# Patient Record
Sex: Female | Born: 1980 | Race: White | Hispanic: No | Marital: Single | Smoking: Never smoker
Health system: Southern US, Community
[De-identification: ages and names within clinical notes are randomized; demographics above are authoritative.]

## PROBLEM LIST (undated history)

## (undated) DIAGNOSIS — E039 Hypothyroidism, unspecified: Secondary | ICD-10-CM

## (undated) DIAGNOSIS — K219 Gastro-esophageal reflux disease without esophagitis: Secondary | ICD-10-CM

## (undated) DIAGNOSIS — J45909 Unspecified asthma, uncomplicated: Secondary | ICD-10-CM

## (undated) DIAGNOSIS — G4730 Sleep apnea, unspecified: Secondary | ICD-10-CM

## (undated) DIAGNOSIS — R519 Headache, unspecified: Secondary | ICD-10-CM

## (undated) DIAGNOSIS — F419 Anxiety disorder, unspecified: Secondary | ICD-10-CM

## (undated) DIAGNOSIS — M797 Fibromyalgia: Secondary | ICD-10-CM

## (undated) DIAGNOSIS — F32A Depression, unspecified: Secondary | ICD-10-CM

## (undated) HISTORY — PX: ABDOMINAL HYSTERECTOMY: SHX81

---

## 2007-11-18 HISTORY — PX: BREAST REDUCTION SURGERY: SHX8

## 2018-08-11 ENCOUNTER — Other Ambulatory Visit (HOSPITAL_COMMUNITY): Payer: Self-pay | Admitting: Neurology

## 2018-08-11 ENCOUNTER — Ambulatory Visit (HOSPITAL_COMMUNITY)
Admission: RE | Admit: 2018-08-11 | Discharge: 2018-08-11 | Disposition: A | Discharge status: Home / Self Care | Payer: Medicaid Other | Source: Ambulatory Visit | Attending: Neurology | Admitting: Neurology

## 2018-08-11 DIAGNOSIS — M25512 Pain in left shoulder: Secondary | ICD-10-CM

## 2018-09-16 ENCOUNTER — Encounter (HOSPITAL_COMMUNITY): Payer: Self-pay

## 2018-09-16 ENCOUNTER — Telehealth (HOSPITAL_COMMUNITY): Payer: Self-pay | Admitting: Occupational Therapy

## 2018-09-16 ENCOUNTER — Ambulatory Visit (HOSPITAL_COMMUNITY): Payer: Medicaid Other | Admitting: Occupational Therapy

## 2018-09-16 NOTE — Telephone Encounter (Signed)
Patient is not feeling well

## 2018-09-28 ENCOUNTER — Other Ambulatory Visit: Payer: Self-pay

## 2018-09-28 ENCOUNTER — Ambulatory Visit (HOSPITAL_COMMUNITY): Payer: Medicaid Other | Attending: Neurology

## 2018-09-28 ENCOUNTER — Encounter (HOSPITAL_COMMUNITY): Payer: Self-pay

## 2018-09-28 DIAGNOSIS — R29898 Other symptoms and signs involving the musculoskeletal system: Secondary | ICD-10-CM | POA: Diagnosis present

## 2018-09-28 DIAGNOSIS — G8929 Other chronic pain: Secondary | ICD-10-CM | POA: Insufficient documentation

## 2018-09-28 DIAGNOSIS — M25612 Stiffness of left shoulder, not elsewhere classified: Secondary | ICD-10-CM | POA: Insufficient documentation

## 2018-09-28 DIAGNOSIS — M25512 Pain in left shoulder: Secondary | ICD-10-CM | POA: Insufficient documentation

## 2018-09-28 NOTE — Therapy (Signed)
[facility] [address] [place], [place], [address] Phone: [phone]   Fax:  [phone]  Occupational Therapy Evaluation  Patient Details  Name: Sue [name] MRN: [id number] Date of Birth: Feb 17, 1981 Referring Provider (OT): Jorge Mandril, NP   Encounter Date: 09/28/2018  OT End of Session - 09/28/18 1507    Visit Number  1    Number of Visits  8    Date for OT Re-Evaluation  10/26/18    Authorization Type  Medicaid - requesting initial 3 visits from medicaid on 09/28/18    OT Start Time  1118    OT Stop Time  1218    OT Time Calculation (min)  60 min    Activity Tolerance  Patient tolerated treatment well;Patient limited by pain    Behavior During Therapy  [facility] for tasks assessed/performed   tearful at times when discussing pain      History reviewed. No pertinent past medical history.  History reviewed. No pertinent surgical history.  There were no vitals filed for this visit.  Subjective Assessment - 09/28/18 1140    Subjective   S: I've been in a lot of pain for a long time.     Pertinent History  Patient is a 37 y/o female presenting with new onset of increased left shoulder pain with no known cause. Increased pain begain approximately 4-5 months ago. Typical average pain level stays between 5 and 8/10. Recently, pain level has been increasing and remaining at the higher range. ROM and strength have been affected. Jorge Mandril, NP has referred patient to occupational therapy for evaluation and treatment.     Patient Stated Goals  To increase ROM and strength.     Currently in Pain?  Yes    Pain Score  7     Pain Location  Shoulder    Pain Orientation  Left    Pain Descriptors / Indicators  Aching    Pain Type  Chronic pain    Pain Radiating Towards  shoulder down elbow    Pain Onset  More than a month ago    Pain Frequency  Constant    Aggravating Factors   Cold aggrevates    Pain Relieving Factors  heating pad, not  moving it.    Effect of Pain on Daily Activities  Mod-severe effect    Multiple Pain Sites  No        [facility] OT Assessment - 09/28/18 1132      Assessment   Medical Diagnosis  Left shoulder pain    Referring Provider (OT)  Jorge Mandril, NP    Onset Date/Surgical Date  --   4-5 months   Hand Dominance  Right    Next MD Visit  10/06/18    Prior Therapy  None for this condition      Precautions   Precautions  None      Restrictions   Weight Bearing Restrictions  No      Balance Screen   Has the patient fallen in the past 6 months  No   Several close calls     Home  Environment   Family/patient expects to be discharged to:  Private residence    Living Arrangements  Children    Home Equipment  [place] - single point    Additional Comments  45 year old daughter      Prior Function   Level of Independence  Requires assistive device for independence    Vocation  On disability   SSI     ADL   ADL comments  Difficulty using the cane in the left hand due to pain in the shoulder. patient normally alternates between the right and left hand when walking with cane. Patient is unable to rely on her LUE to assist as much as before. LUE locks up. Is normally weaker. Difficulty getting out of bed at times.       Mobility   Mobility Status  Independent      Written Expression   Dominant Hand  Right      Vision - History   Baseline Vision  Wears glasses all the time      Cognition   Overall Cognitive Status  Within Functional Limits for tasks assessed      Posture/Postural Control   Posture/Postural Control  Postural limitations    Postural Limitations  Rounded Shoulders;Forward head      ROM / Strength   AROM / PROM / Strength  AROM;Strength;PROM      Palpation   Palpation comment  Moderate fascial restrictions in left upper arm, trapezius, and scapularis region.       AROM   Overall AROM Comments  Assessed seated. IR/er adducted    AROM Assessment Site  Shoulder     Right/Left Shoulder  Left    Left Shoulder Flexion  90 Degrees   right: 170   Left Shoulder ABduction  35 Degrees   right: 170   Left Shoulder Internal Rotation  90 Degrees   right: 90   Left Shoulder External Rotation  70 Degrees   right: 70     PROM   Overall PROM Comments  Assessed supine. IR/er adducted.    PROM Assessment Site  Shoulder    Right/Left Shoulder  Left    Left Shoulder Flexion  97 Degrees    Left Shoulder ABduction  80 Degrees    Left Shoulder Internal Rotation  90 Degrees    Left Shoulder External Rotation  70 Degrees      Strength   Overall Strength Comments  Assessed seated. IR/er adducted    Strength Assessment Site  Shoulder    Right/Left Shoulder  Left    Left Shoulder Flexion  3-/5    Left Shoulder ABduction  3-/5    Left Shoulder Internal Rotation  4-/5    Left Shoulder External Rotation  4+/5                      OT Education - 09/28/18 1507    Education Details  table slides    Person(s) Educated  Patient    Methods  Explanation;Verbal cues;Demonstration;Handout    Comprehension  Verbalized understanding;Returned demonstration       OT Short Term Goals - 09/28/18 1515      OT SHORT TERM GOAL #1   Title  patient will be educated and independent with a HEP to faciliate her progress in therapy and increase her ability to use her LUE as her non dominant with all daily tasks.    Time  4    Period  Weeks    Status  New    Target Date  10/26/18      OT SHORT TERM GOAL #2   Title  Patient will increase Left shoulder A/ROM to WFL/equal to right UE in order to be able to reach at or above shoulder level in order to complete normal daily tasks.     Time  4  Period  Weeks    Status  New      OT SHORT TERM GOAL #3   Title  Patient will increase her LUE strength to 4/5 in order to return to using her left UE for typical daily lifting tasks with increased comfort.     Time  4    Period  Weeks    Status  New      OT SHORT TERM  GOAL #4   Title  patient will report a decrease of pain level in her left arm to 5/10 or less with use of pain management techniques if needed in order to use her cane in either her Right or Left UE if needed.     Time  4    Period  Weeks    Status  New      OT SHORT TERM GOAL #5   Title  Patient will decrease fascial restrictions in left UE to minimal amount or less in order to increase functional mobility and comfort level in order to complete daily tasks.     Time  4    Period  Weeks    Status  New               Plan - 09/28/18 1510    Clinical Impression Statement  A: Patient is a 37 y/o female S/P left shoulder pain causing increased fascial restrictions and decreased strength and ROM resulting in difficulty completing daily tasks using her left UE as her non dominant extremity.     Occupational Profile and client history currently impacting functional performance  motivated to participate in therapy.     Occupational performance deficits (Please refer to evaluation for details):  ADL's;IADL's;Rest and Sleep;Leisure    Rehab Potential  Excellent    Current Impairments/barriers affecting progress:  chronic pain. multiple issues causing pain. Mulitple diagnosis' with minimal success with treatment or management.     OT Frequency  2x / week    OT Duration  4 weeks    OT Treatment/Interventions  Therapeutic exercise;Self-care/ADL training;Manual Therapy;Neuromuscular education;Ultrasound;Therapeutic activities;DME and/or AE instruction;Paraffin;Cryotherapy;Electrical Stimulation;Moist Heat;Passive range of motion;Patient/family education    Plan  P: Patient will benefit from skilled OT services to increase functional performance during daily tasks while using her left UE as her non dominant extremity with greater comfort. Treatment plan: Myofascial release, manual stretching, AA/ROM, A/ROM, general shoulder and scapular strengthening. Modalities PRN. Provide information on pain  management clinics.     Clinical Decision Making  Multiple treatment options, significant modification of task necessary    Consulted and Agree with Plan of Care  Patient       Patient will benefit from skilled therapeutic intervention in order to improve the following deficits and impairments:  Decreased coping skills, Decreased strength, Decreased activity tolerance, Pain, Decreased range of motion, Impaired UE functional use  Visit Diagnosis: Other symptoms and signs involving the musculoskeletal system - Plan: Ot plan of care cert/re-cert  Chronic left shoulder pain - Plan: Ot plan of care cert/re-cert  Stiffness of left shoulder, not elsewhere classified - Plan: Ot plan of care cert/re-cert    Problem List There are no active problems to display for this patient.  Limmie Patricia, OTR/L,CBIS  [phone]  09/28/2018, 3:36 PM  [place] [facility] [address] [place], [place], [address] Phone: [phone]   Fax:  [phone]  Name: Sue [name] MRN: [id number] Date of Birth: 02/16/1981

## 2018-09-28 NOTE — Patient Instructions (Signed)
Complete 2-3 times a day. Each exercise 1-3 minutes.    SHOULDER: Flexion On Table   Place hands on towel placed on table, elbows straight. Lean forward with you upper body, pushing towel away from body.   Abduction (Passive)   With arm out to side, resting on towel placed on table, keeping trunk away from table, lean to the side while pushing towel away from body.  Copyright  VHI. All rights reserved.     Internal Rotation (Assistive)   Seated with elbow bent at right angle and held against side, slide arm on table surface in an inward arc keeping elbow anchored in place. Activity: Use this motion to brush crumbs off the table.  Copyright  VHI. All rights reserved.

## 2018-10-05 ENCOUNTER — Telehealth (HOSPITAL_COMMUNITY): Payer: Self-pay | Admitting: Internal Medicine

## 2018-10-05 ENCOUNTER — Ambulatory Visit (HOSPITAL_COMMUNITY): Payer: Medicaid Other

## 2018-10-05 NOTE — Telephone Encounter (Signed)
10/05/18  PT CX VIA PHONE TREE -- I CALLED TO CONFIRM AND SHE SAID SHE HAD A DRS APPT AND WILL RESCHEDULE WHEN SHE COMES IN AGAIN

## 2018-10-07 ENCOUNTER — Ambulatory Visit (HOSPITAL_COMMUNITY): Payer: Medicaid Other

## 2018-10-07 ENCOUNTER — Encounter (HOSPITAL_COMMUNITY): Payer: Self-pay

## 2018-10-07 DIAGNOSIS — R29898 Other symptoms and signs involving the musculoskeletal system: Secondary | ICD-10-CM | POA: Diagnosis not present

## 2018-10-07 DIAGNOSIS — G8929 Other chronic pain: Secondary | ICD-10-CM

## 2018-10-07 DIAGNOSIS — M25612 Stiffness of left shoulder, not elsewhere classified: Secondary | ICD-10-CM

## 2018-10-07 DIAGNOSIS — M25512 Pain in left shoulder: Secondary | ICD-10-CM

## 2018-10-07 NOTE — Therapy (Signed)
[place] [facility] [address] [place], [place], [address] Phone: [phone]   Fax:  [phone]  Occupational Therapy Treatment  Patient Details  Name: Sue [name] MRN: [id number] Date of Birth: 1981-01-31 Referring Provider (OT): Jorge Mandril, NP   Encounter Date: 10/07/2018  OT End of Session - 10/07/18 0941    Visit Number  2    Number of Visits  8    Date for OT Re-Evaluation  10/26/18    Authorization Type  Medicaid - requesting initial 3 visits from medicaid on 09/28/18    OT Start Time  0905    OT Stop Time  0945    OT Time Calculation (min)  40 min    Activity Tolerance  Patient tolerated treatment well    Behavior During Therapy  [facility] for tasks assessed/performed   tearful at times when discussing pain      History reviewed. No pertinent past medical history.  History reviewed. No pertinent surgical history.  There were no vitals filed for this visit.  Subjective Assessment - 10/07/18 0916    Subjective   S: I didn't take my pain pill this morning because I wouldn't be able to function if I did.     Currently in Pain?  Yes    Pain Score  8     Pain Location  Shoulder    Pain Orientation  Left    Pain Descriptors / Indicators  Aching    Pain Type  Chronic pain    Pain Radiating Towards  shoulder down to elbow    Pain Onset  More than a month ago    Pain Frequency  Constant    Aggravating Factors   cold    Pain Relieving Factors  heating pad, not moving it    Effect of Pain on Daily Activities  mod-severe effect    Multiple Pain Sites  No         [facility] OT Assessment - 10/07/18 0934      Assessment   Medical Diagnosis  Left shoulder pain      Precautions   Precautions  None               OT Treatments/Exercises (OP) - 10/07/18 0935      Exercises   Exercises  Shoulder      Shoulder Exercises: Supine   Protraction  PROM;AROM;10 reps    Horizontal ABduction  PROM;10 reps    External Rotation   PROM;AAROM;10 reps   abducted   Internal Rotation  PROM;AAROM;10 reps   abducted   Flexion  PROM;AAROM;10 reps    ABduction  PROM;10 reps      Manual Therapy   Manual Therapy  Myofascial release    Manual therapy comments  Manual therapy completed prior to exercises.     Myofascial Release  Myofascial release and manual stretching completed to left upper arm, trapezius, and scapularis region to decrease fascial restrictions and increase joint mobility in a pain free zone.              OT Education - 10/07/18 0941    Education Details  Reviewed goals with patient    Person(s) Educated  Patient    Methods  Explanation    Comprehension  Verbalized understanding       OT Short Term Goals - 10/07/18 0942      OT SHORT TERM GOAL #1   Title  patient will be educated and independent with a HEP  to faciliate her progress in therapy and increase her ability to use her LUE as her non dominant with all daily tasks.    Time  4    Period  Weeks    Status  On-going      OT SHORT TERM GOAL #2   Title  Patient will increase Left shoulder A/ROM to WFL/equal to right UE in order to be able to reach at or above shoulder level in order to complete normal daily tasks.     Time  4    Period  Weeks    Status  On-going      OT SHORT TERM GOAL #3   Title  Patient will increase her LUE strength to 4/5 in order to return to using her left UE for typical daily lifting tasks with increased comfort.     Time  4    Period  Weeks    Status  On-going      OT SHORT TERM GOAL #4   Title  patient will report a decrease of pain level in her left arm to 5/10 or less with use of pain management techniques if needed in order to use her cane in either her Right or Left UE if needed.     Time  4    Period  Weeks    Status  On-going      OT SHORT TERM GOAL #5   Title  Patient will decrease fascial restrictions in left UE to minimal amount or less in order to increase functional mobility and comfort level  in order to complete daily tasks.     Time  4    Period  Weeks    Status  On-going               Plan - 10/07/18 9794    Clinical Impression Statement  A: pt arrives to therapy with continued elevated pain although was able to tolerate full P/ROM of her LUE. Patient completed AA/ROM supine due to full range of motion. VC for form and technique.     Plan  P: Continue with AA/ROM and progress to seated AA/ROM if able to tolerate. Continue with manual therapy for fascial restrictions on the anterior and upper trapezius and scapularis region.     Consulted and Agree with Plan of Care  Patient       Patient will benefit from skilled therapeutic intervention in order to improve the following deficits and impairments:  Decreased coping skills, Decreased strength, Decreased activity tolerance, Pain, Decreased range of motion, Impaired UE functional use  Visit Diagnosis: Other symptoms and signs involving the musculoskeletal system  Chronic left shoulder pain  Stiffness of left shoulder, not elsewhere classified    Problem List There are no active problems to display for this patient.  Limmie Patricia, OTR/L,CBIS  [phone]  10/07/2018, 9:46 AM  [place] [facility] [address] [place], [place], [address] Phone: [phone]   Fax:  [phone]  Name: Sue [name] MRN: [id number] Date of Birth: [birth year]

## 2018-10-08 ENCOUNTER — Ambulatory Visit (HOSPITAL_COMMUNITY): Payer: Medicaid Other

## 2018-10-08 ENCOUNTER — Encounter (HOSPITAL_COMMUNITY): Payer: Self-pay

## 2018-10-08 DIAGNOSIS — M25612 Stiffness of left shoulder, not elsewhere classified: Secondary | ICD-10-CM

## 2018-10-08 DIAGNOSIS — R29898 Other symptoms and signs involving the musculoskeletal system: Secondary | ICD-10-CM

## 2018-10-08 DIAGNOSIS — G8929 Other chronic pain: Secondary | ICD-10-CM

## 2018-10-08 DIAGNOSIS — M25512 Pain in left shoulder: Secondary | ICD-10-CM

## 2018-10-08 NOTE — Therapy (Signed)
[place] [facility] [address] [place], [place], [address] Phone: [phone]   Fax:  [phone]  Occupational Therapy Treatment  Patient Details  Name: Sue [name] MRN: [id number] Date of Birth: [birth year] Referring Provider (OT): Jorge Mandril, NP   Encounter Date: 10/08/2018  OT End of Session - 10/08/18 1103    Visit Number  3    Number of Visits  8    Date for OT Re-Evaluation  10/26/18    Authorization Type  Medicaid     Authorization Time Period  Approved 3 visits (09/29/18-10/12/18)    Authorization - Visit Number  2    Authorization - Number of Visits  3    OT Start Time  0950    OT Stop Time  1030    OT Time Calculation (min)  40 min    Activity Tolerance  Patient tolerated treatment well    Behavior During Therapy  [facility] for tasks assessed/performed       History reviewed. No pertinent past medical history.  History reviewed. No pertinent surgical history.  There were no vitals filed for this visit.  Subjective Assessment - 10/08/18 0959    Subjective   S: I really wish I could I take my pain pill right now.     Currently in Pain?  Yes    Pain Score  9     Pain Location  Shoulder    Pain Orientation  Left    Pain Descriptors / Indicators  Aching    Pain Type  Chronic pain         [facility] OT Assessment - 10/08/18 1000      Assessment   Medical Diagnosis  Left shoulder pain      Precautions   Precautions  None               OT Treatments/Exercises (OP) - 10/08/18 1000      Exercises   Exercises  Shoulder      Shoulder Exercises: Supine   Protraction  AAROM;10 reps    Horizontal ABduction  AAROM;10 reps    External Rotation  AAROM;10 reps    Internal Rotation  AAROM;10 reps    Flexion  AAROM;10 reps    ABduction  AAROM;10 reps      Shoulder Exercises: Seated   Elevation  --    Protraction  AAROM;10 reps    Flexion  AAROM;10 reps    Abduction  AAROM;10 reps      Modalities   Modalities   Electrical Stimulation;Moist Heat      Moist Heat Therapy   Number Minutes Moist Heat  10 Minutes    Moist Heat Location  Shoulder      Electrical Stimulation   Electrical Stimulation Location  left shoulder and upper trapezius region    Electrical Stimulation Action  interferential    Electrical Stimulation Parameters  11.5 CV    Electrical Stimulation Goals  Pain             OT Education - 10/08/18 1102    Education Details  Provided protraction, abduction, and flexion for AA/ROM shoulder seated.     Person(s) Educated  Patient    Methods  Explanation;Handout;Verbal cues;Demonstration    Comprehension  Verbalized understanding;Returned demonstration       OT Short Term Goals - 10/07/18 0942      OT SHORT TERM GOAL #1   Title  patient will be educated and independent with a HEP to faciliate  her progress in therapy and increase her ability to use her LUE as her non dominant with all daily tasks.    Time  4    Period  Weeks    Status  On-going      OT SHORT TERM GOAL #2   Title  Patient will increase Left shoulder A/ROM to WFL/equal to right UE in order to be able to reach at or above shoulder level in order to complete normal daily tasks.     Time  4    Period  Weeks    Status  On-going      OT SHORT TERM GOAL #3   Title  Patient will increase her LUE strength to 4/5 in order to return to using her left UE for typical daily lifting tasks with increased comfort.     Time  4    Period  Weeks    Status  On-going      OT SHORT TERM GOAL #4   Title  patient will report a decrease of pain level in her left arm to 5/10 or less with use of pain management techniques if needed in order to use her cane in either her Right or Left UE if needed.     Time  4    Period  Weeks    Status  On-going      OT SHORT TERM GOAL #5   Title  Patient will decrease fascial restrictions in left UE to minimal amount or less in order to increase functional mobility and comfort level in order  to complete daily tasks.     Time  4    Period  Weeks    Status  On-going               Plan - 10/08/18 1103    Clinical Impression Statement  A: Patient arrives with elevated pain and reports that she is unable to tolerate any type of touching. ES and heat applied at start of session to decrease pain level and increase comfort. Patient reports numbness after ES/heat. Complete supine AA/ROM and some seated as well. HEP was updated to include the 3 seated AA/ROM shoulder exercises. Patient was instructed to complete the table slides if she felt they were needed otherwise complete the 3 AA/ROM exercises. Patient verbalized understanding.     Plan  P: Complete all seated AA/ROM shoulder exercises. Add wall wash. Request additional visit from medicaid.     Consulted and Agree with Plan of Care  Patient       Patient will benefit from skilled therapeutic intervention in order to improve the following deficits and impairments:  Decreased coping skills, Decreased strength, Decreased activity tolerance, Pain, Decreased range of motion, Impaired UE functional use  Visit Diagnosis: Other symptoms and signs involving the musculoskeletal system  Chronic left shoulder pain  Stiffness of left shoulder, not elsewhere classified    Problem List There are no active problems to display for this patient.  Limmie Patricia, OTR/L,CBIS  [phone]  10/08/2018, 11:09 AM  [place] [facility] [address] [place], [place], [address] Phone: [phone]   Fax:  [phone]  Name: Sue [name] MRN: [id number] Date of Birth: 1981-05-01

## 2018-10-08 NOTE — Patient Instructions (Signed)
Perform each exercise ___10_____ reps. 2-3x days.   Protraction - STANDING  Start by holding a wand or cane at chest height.  Next, slowly push the wand outwards in front of your body so that your elbows become fully straightened. Then, return to the original position.     Shoulder FLEXION - STANDING - PALMS DOWN  In the standing position, hold a wand/cane with both arms, palms down on both sides. Raise up the wand/cane allowing your unaffected arm to perform most of the effort. Your affected arm should be partially relaxed.        Shoulder ABDUCTION - STANDING  While holding a wand/cane palm face up on the injured side and palm face down on the uninjured side, slowly raise up your injured arm to the side.

## 2018-10-11 ENCOUNTER — Ambulatory Visit (HOSPITAL_COMMUNITY): Payer: Medicaid Other

## 2018-10-11 DIAGNOSIS — M25612 Stiffness of left shoulder, not elsewhere classified: Secondary | ICD-10-CM

## 2018-10-11 DIAGNOSIS — R29898 Other symptoms and signs involving the musculoskeletal system: Secondary | ICD-10-CM

## 2018-10-11 DIAGNOSIS — G8929 Other chronic pain: Secondary | ICD-10-CM

## 2018-10-11 DIAGNOSIS — M25512 Pain in left shoulder: Secondary | ICD-10-CM

## 2018-10-11 NOTE — Therapy (Addendum)
[place], [place], [address] Phone: [phone]   Fax:  [phone]  Occupational Therapy Treatment  Patient Details  Name: Sue [name] MRN: [id number] Date of Birth: 02-03-1981 Referring Provider (OT): Barton Fanny, NP   Encounter Date: 10/11/2018  OT End of Session - 10/11/18 1429    Visit Number  4    Number of Visits  8    Date for OT Re-Evaluation  10/26/18    Authorization Type  Medicaid     Authorization Time Period  Approved 3 visits (09/29/18-10/12/18) *Requesting 12 visits from Medicaid on 10/11/18.    Authorization - Visit Number  3    Authorization - Number of Visits  3    OT Start Time  7253   pt arrived late   OT Stop Time  1430    OT Time Calculation (min)  35 min    Activity Tolerance  Patient tolerated treatment well    Behavior During Therapy  WFL for tasks assessed/performed       No past medical history on file.  No past surgical history on file.  There were no vitals filed for this visit.  Subjective Assessment - 10/11/18 1425    Subjective   S: It's not hurting as bad as it usually does.     Currently in Pain?  Yes    Pain Score  7     Pain Location  Shoulder    Pain Orientation  Left    Pain Descriptors / Indicators  Aching    Pain Type  Chronic pain         [facility] OT Assessment - 10/11/18 1400      Assessment   Medical Diagnosis  Left shoulder pain    Referring Provider (OT)  Barton Fanny, NP      Precautions   Precautions  None      Home  Environment   Family/patient expects to be discharged to:  Private residence      AROM   Overall AROM Comments  Assessed seated. IR/er adducted    AROM Assessment Site  Shoulder    Right/Left Shoulder  Left    Left Shoulder Flexion  137 Degrees   previous: 90   Left Shoulder ABduction  111 Degrees   previous: 35   Left Shoulder Internal Rotation  90 Degrees   previous: 90   Left Shoulder External Rotation  80 Degrees    previous; 70     PROM   Overall PROM Comments  Assessed supine. IR/er adducted.    PROM Assessment Site  Shoulder    Right/Left Shoulder  Left    Left Shoulder Flexion  135 Degrees   previous: 97   Left Shoulder ABduction  180 Degrees   previous; 80   Left Shoulder Internal Rotation  90 Degrees   previous: 90   Left Shoulder External Rotation  85 Degrees   prvious: 70     Strength   Overall Strength Comments  Assessed seated. IR/er adducted    Strength Assessment Site  Shoulder    Right/Left Shoulder  Left    Left Shoulder Flexion  3+/5   previous: 3-/5   Left Shoulder ABduction  3+/5   previous: 3-/5   Left Shoulder Internal Rotation  5/5   previous; 4-/5   Left Shoulder External Rotation  5/5   previous: 4+/5              OT Treatments/Exercises (OP) -  10/11/18 1425      Exercises   Exercises  Shoulder      Shoulder Exercises: Supine   Protraction  PROM;5 reps    Horizontal ABduction  PROM;5 reps    External Rotation  PROM;5 reps    Internal Rotation  PROM;5 reps    Flexion  PROM;5 reps    ABduction  PROM;5 reps      Shoulder Exercises: Seated   Protraction  AAROM;10 reps    Horizontal ABduction  AAROM;10 reps    External Rotation  AAROM;10 reps    Internal Rotation  AAROM;10 reps    Flexion  AAROM;10 reps    Abduction  AAROM;10 reps      Shoulder Exercises: Pulleys   Flexion  1 minute    ABduction  1 minute      Shoulder Exercises: ROM/Strengthening   Wall Wash  1'             OT Education - 10/11/18 1729    Education Details  Reviewed goals with patient and progress in therapy since 3 visits. Recommended continuing therapy for remainder of planned duration. Updated HEP to include AA/ROM shoulder exercises.     Person(s) Educated  Patient    Methods  Explanation;Demonstration;Handout;Verbal cues    Comprehension  Verbalized understanding;Returned demonstration       OT Short Term Goals - 10/11/18 1414      OT SHORT TERM GOAL #1    Title  patient will be educated and independent with a HEP to faciliate her progress in therapy and increase her ability to use her LUE as her non dominant with all daily tasks.    Time  4    Period  Weeks    Status  Achieved      OT SHORT TERM GOAL #2   Title  Patient will increase Left shoulder A/ROM to WFL/equal to right UE in order to be able to reach at or above shoulder level in order to complete normal daily tasks.     Time  4    Period  Weeks    Status  On-going      OT SHORT TERM GOAL #3   Title  Patient will increase her LUE strength to 4/5 in order to return to using her left UE for typical daily lifting tasks with increased comfort.     Time  4    Period  Weeks    Status  On-going      OT SHORT TERM GOAL #4   Title  patient will report a decrease of pain level in her left arm to 5/10 or less with use of pain management techniques if needed in order to use her cane in either her Right or Left UE if needed.     Time  4    Period  Weeks    Status  On-going      OT SHORT TERM GOAL #5   Title  Patient will decrease fascial restrictions in left UE to minimal amount or less in order to increase functional mobility and comfort level in order to complete daily tasks.     Time  4    Period  Weeks    Status  On-going               Plan - 10/11/18 1730    Clinical Impression Statement  A: Reassessment completed this date to request additional visits from [facility]. patient has met 1 therapy goal related to her  independence with her HEP completion. Patient has shown improvement with her Active and P/ROM. Strength has increased with IR/er. Patient continues to have deficits with these areas and has not met her goal for strength and ROM. Patient reports a continued pain level of 7 to 9/10 daily. AA/ROM were completed this date in seated and HEP was updated. VC were provided for form and technique.     Plan  P: COntinue with OT services focuses on mentioned deficits. complete  manual techniques for fascial restrictions as needed. Complete A/ROM exercises supine if able to tolerate.     Consulted and Agree with Plan of Care  Patient       Patient will benefit from skilled therapeutic intervention in order to improve the following deficits and impairments:  Decreased coping skills, Decreased strength, Decreased activity tolerance, Pain, Decreased range of motion, Impaired UE functional use  Visit Diagnosis: Other symptoms and signs involving the musculoskeletal system  Chronic left shoulder pain  Stiffness of left shoulder, not elsewhere classified    Problem List There are no active problems to display for this patient.    Ailene Ravel, OTR/L,CBIS  [phone]  10/11/2018, 5:43 PM  [facility] [address] [place], [place], [address] Phone: [phone]   Fax:  [phone]  Name: Sue [name] MRN: [id number] Date of Birth: 05-07-1981

## 2018-10-11 NOTE — Patient Instructions (Signed)
Perform each exercise ___10_____ reps. 2-3x days.   Protraction - STANDING  Start by holding a wand or cane at chest height.  Next, slowly push the wand outwards in front of your body so that your elbows become fully straightened. Then, return to the original position.     Shoulder FLEXION - STANDING - PALMS DOWN  In the standing position, hold a wand/cane with both arms, palms down on both sides. Raise up the wand/cane allowing your unaffected arm to perform most of the effort. Your affected arm should be partially relaxed.      Internal/External ROTATION - STANDING  In the standing position, hold a wand/cane with both hands keeping your elbows bent. Move your arms and wand/cane to one side.  Your affected arm should be partially relaxed while your unaffected arm performs most of the effort.       Shoulder ABDUCTION - STANDING  While holding a wand/cane palm face up on the injured side and palm face down on the uninjured side, slowly raise up your injured arm to the side.           Horizontal Abduction/Adduction      Straight arms holding cane at shoulder height, bring cane to right, center, left. Repeat starting to left.   Copyright  V[employer]. All rights reserved.       

## 2018-10-18 ENCOUNTER — Telehealth (HOSPITAL_COMMUNITY): Payer: Self-pay | Admitting: Internal Medicine

## 2018-10-18 ENCOUNTER — Ambulatory Visit (HOSPITAL_COMMUNITY): Payer: Medicaid Other

## 2018-10-18 NOTE — Telephone Encounter (Signed)
10/18/18  pt cx because she is sick and hoping to get a z-pack today

## 2018-10-21 ENCOUNTER — Encounter (HOSPITAL_COMMUNITY): Payer: Medicaid Other | Admitting: Occupational Therapy

## 2018-10-25 ENCOUNTER — Ambulatory Visit (HOSPITAL_COMMUNITY): Payer: Medicaid Other

## 2018-10-25 ENCOUNTER — Telehealth (HOSPITAL_COMMUNITY): Payer: Self-pay | Admitting: Internal Medicine

## 2018-10-25 NOTE — Telephone Encounter (Signed)
10/25/18  Pt left a message to cx

## 2018-10-28 ENCOUNTER — Encounter (HOSPITAL_COMMUNITY): Payer: Self-pay | Admitting: Occupational Therapy

## 2018-10-28 ENCOUNTER — Ambulatory Visit (HOSPITAL_COMMUNITY): Payer: Medicaid Other | Attending: Neurology | Admitting: Occupational Therapy

## 2018-10-28 DIAGNOSIS — R29898 Other symptoms and signs involving the musculoskeletal system: Secondary | ICD-10-CM | POA: Insufficient documentation

## 2018-10-28 DIAGNOSIS — M25612 Stiffness of left shoulder, not elsewhere classified: Secondary | ICD-10-CM | POA: Diagnosis present

## 2018-10-28 DIAGNOSIS — G8929 Other chronic pain: Secondary | ICD-10-CM | POA: Diagnosis present

## 2018-10-28 DIAGNOSIS — M25512 Pain in left shoulder: Secondary | ICD-10-CM | POA: Diagnosis present

## 2018-10-28 NOTE — Therapy (Addendum)
[facility] [address] [place], [place], [address] Phone: [phone]   Fax:  [phone]  Occupational Therapy Treatment  Patient Details  Name: Sue [name] MRN: [id number] Date of Birth: 12-02-1980 Referring Provider (OT): Jorge Mandril, NP   Encounter Date: 10/28/2018  OT End of Session - 10/28/18 1011    Visit Number  5    Number of Visits  8    Date for OT Re-Evaluation  11/12/18    Authorization Type  Medicaid     Authorization Time Period  6 visits approved 12/2-12/22    Authorization - Visit Number  1    Authorization - Number of Visits  6    OT Start Time  0904    OT Stop Time  0945    OT Time Calculation (min)  41 min    Activity Tolerance  Patient tolerated treatment well    Behavior During Therapy  [facility] for tasks assessed/performed       History reviewed. No pertinent past medical history.  History reviewed. No pertinent surgical history.  There were no vitals filed for this visit.  Subjective Assessment - 10/28/18 0904    Subjective   S: The past couple of days have been really rough.     Currently in Pain?  Yes    Pain Score  4     Pain Location  Shoulder    Pain Orientation  Left    Pain Descriptors / Indicators  Aching;Sore    Pain Type  Chronic pain    Pain Radiating Towards  neck and shoulder    Pain Onset  In the past 7 days    Pain Frequency  Intermittent    Aggravating Factors   cold    Pain Relieving Factors  heating pad, not moving it    Effect of Pain on Daily Activities  mod/severe effect.     Multiple Pain Sites  No         [facility] OT Assessment - 10/28/18 0904      Assessment   Medical Diagnosis  Left shoulder pain    Referring Provider (OT)  Jorge Mandril, NP      Precautions   Precautions  None      AROM   Overall AROM Comments  Assessed seated. IR/er adducted    AROM Assessment Site  Shoulder    Right/Left Shoulder  Left    Left Shoulder Flexion  154 Degrees   137 previous    Left Shoulder ABduction  165 Degrees   111 previous   Left Shoulder Internal Rotation  90 Degrees   same as previous   Left Shoulder External Rotation  84 Degrees   80 previous     PROM   Overall PROM Comments  Assessed supine. IR/er adducted.    PROM Assessment Site  Shoulder    Right/Left Shoulder  Left    Left Shoulder Flexion  160 Degrees   135 previous   Left Shoulder ABduction  180 Degrees   same as previous   Left Shoulder Internal Rotation  90 Degrees   same as previous   Left Shoulder External Rotation  90 Degrees   85 previous     Strength   Overall Strength Comments  Assessed seated. IR/er adducted    Strength Assessment Site  Shoulder    Right/Left Shoulder  Left    Left Shoulder Flexion  4/5   3+/5 previous   Left Shoulder ABduction  4-/5  3+/5 previous   Left Shoulder Internal Rotation  5/5   same as previous   Left Shoulder External Rotation  5/5   same as previous              OT Treatments/Exercises (OP) - 10/28/18 0912      Exercises   Exercises  Shoulder      Shoulder Exercises: Supine   Protraction  PROM;5 reps    Horizontal ABduction  PROM;5 reps    External Rotation  PROM;5 reps    Internal Rotation  PROM;5 reps    Flexion  PROM;5 reps    ABduction  PROM;5 reps      Shoulder Exercises: Seated   Protraction  AROM;10 reps    Horizontal ABduction  AROM;10 reps    External Rotation  AROM;10 reps    Internal Rotation  AROM;10 reps    Flexion  AROM;10 reps    Abduction  AROM;10 reps      Shoulder Exercises: ROM/Strengthening   Proximal Shoulder Strengthening, Seated  10X each no rest breaks      Manual Therapy   Manual Therapy  Myofascial release    Manual therapy comments  Manual therapy completed prior to exercises.     Myofascial Release  Myofascial release and manual stretching completed to left upper arm, trapezius, and scapularis region to decrease fascial restrictions and increase joint mobility in a pain free zone.                 OT Short Term Goals - 10/11/18 1414      OT SHORT TERM GOAL #1   Title  patient will be educated and independent with a HEP to faciliate her progress in therapy and increase her ability to use her LUE as her non dominant with all daily tasks.    Time  4    Period  Weeks    Status  Achieved      OT SHORT TERM GOAL #2   Title  Patient will increase Left shoulder A/ROM to WFL/equal to right UE in order to be able to reach at or above shoulder level in order to complete normal daily tasks.     Time  4    Period  Weeks    Status  On-going      OT SHORT TERM GOAL #3   Title  Patient will increase her LUE strength to 4/5 in order to return to using her left UE for typical daily lifting tasks with increased comfort.     Time  4    Period  Weeks    Status  On-going      OT SHORT TERM GOAL #4   Title  patient will report a decrease of pain level in her left arm to 5/10 or less with use of pain management techniques if needed in order to use her cane in either her Right or Left UE if needed.     Time  4    Period  Weeks    Status  On-going      OT SHORT TERM GOAL #5   Title  Patient will decrease fascial restrictions in left UE to minimal amount or less in order to increase functional mobility and comfort level in order to complete daily tasks.     Time  4    Period  Weeks    Status  On-going               Plan - 10/28/18  1610    Clinical Impression Statement  A: Measurements taken for recertification today. Pt reports severe, sharp pain over the past two days without known cause. Pt able to complete ROM [facility] today, also demonstrates improvements in strength during MMT. Progressed to A/ROM this session, also added proximal shoulder strengthening in sitting, pt able to complete without difficulty. Verbal cuing during exercises for form/technique.     Plan  P: add x to v arms, scapular theraband        Patient will benefit from skilled therapeutic intervention  in order to improve the following deficits and impairments:  Decreased coping skills, Decreased strength, Decreased activity tolerance, Pain, Decreased range of motion, Impaired UE functional use  Visit Diagnosis: Other symptoms and signs involving the musculoskeletal system  Chronic left shoulder pain  Stiffness of left shoulder, not elsewhere classified    Problem List There are no active problems to display for this patient.  Ezra Sites, OTR/L  [phone] 10/28/2018, 10:12 AM  [facility] [address] [place], [place], [address] Phone: [phone]   Fax:  [phone]  Name: Sue [name] MRN: [id number] Date of Birth: 10/07/1981

## 2018-10-28 NOTE — Addendum Note (Signed)
Addended by: Ezra Sites A on: 10/28/2018 10:19 AM   Modules accepted: Orders

## 2018-11-01 ENCOUNTER — Ambulatory Visit (HOSPITAL_COMMUNITY): Payer: Medicaid Other

## 2018-11-01 DIAGNOSIS — M25612 Stiffness of left shoulder, not elsewhere classified: Secondary | ICD-10-CM

## 2018-11-01 DIAGNOSIS — G8929 Other chronic pain: Secondary | ICD-10-CM

## 2018-11-01 DIAGNOSIS — M25512 Pain in left shoulder: Secondary | ICD-10-CM

## 2018-11-01 DIAGNOSIS — R29898 Other symptoms and signs involving the musculoskeletal system: Secondary | ICD-10-CM

## 2018-11-01 NOTE — Patient Instructions (Signed)
Repeat all exercises 10-15 times, 1-2 times per day.  1) Shoulder Protraction    Begin with elbows by your side, slowly "punch" straight out in front of you.      2) Shoulder Flexion  Standing:         Begin with arms at your side with thumbs pointed up, slowly raise both arms up and forward towards overhead.       3) Horizontal abduction/adduction   Standing:           Begin with arms straight out in front of you, bring out to the side in at "T" shape. Keep arms straight entire time.        4) Internal & External Rotation    *No band* -Stand with elbows at the side and elbows bent 90 degrees. Move your forearms away from your body, then bring back inward toward the body.     5) Shoulder Abduction   Standing:       Lying on your back begin with your arms flat on the table next to your side. Slowly move your arms out to the side so that they go overhead, in a jumping jack or snow angel movement.    6) X to V arms (cheerleader move):  Begin with arms straight down, crossed in front of body in an "X". Keeping arms crossed, lift arms straight up overhead. Then spread arms apart into a "V" shape.  Bring back together into x and lower down to starting position.    

## 2018-11-01 NOTE — Therapy (Signed)
[facility] [address] [place], [place], [address] Phone: [phone]   Fax:  [phone]  Occupational Therapy Treatment  Patient Details  Name: Sue [name] MRN: [id number] Date of Birth: 22-Feb-1981 Referring Provider (OT): Jorge Mandril, NP   Encounter Date: 11/01/2018  OT End of Session - 11/01/18 1707    Visit Number  6    Number of Visits  8    Date for OT Re-Evaluation  11/12/18    Authorization Type  Medicaid     Authorization Time Period  6 visits approved 12/2-12/22    Authorization - Visit Number  2    Authorization - Number of Visits  6    OT Start Time  1605    OT Stop Time  1646    OT Time Calculation (min)  41 min    Activity Tolerance  Patient tolerated treatment well    Behavior During Therapy  [facility] for tasks assessed/performed       No past medical history on file.  No past surgical history on file.  There were no vitals filed for this visit.  Subjective Assessment - 11/01/18 1713    Subjective   S: It doesn't hurt as bad as it did last time.     Currently in Pain?  Yes    Pain Score  4     Pain Location  Shoulder    Pain Orientation  Left    Pain Descriptors / Indicators  Aching;Sore    Pain Type  Chronic pain         [facility] OT Assessment - 11/01/18 1707      Assessment   Medical Diagnosis  Left shoulder pain      Precautions   Precautions  None               OT Treatments/Exercises (OP) - 11/01/18 1627      Exercises   Exercises  Shoulder      Shoulder Exercises: Supine   Protraction  PROM;5 reps    Horizontal ABduction  PROM;5 reps    External Rotation  PROM;5 reps    Internal Rotation  PROM;5 reps    Flexion  PROM;5 reps    ABduction  PROM;5 reps      Shoulder Exercises: Seated   Protraction  AROM;10 reps    Horizontal ABduction  AROM;10 reps    External Rotation  AROM;10 reps    Internal Rotation  AROM;10 reps    Flexion  AROM;10 reps    Abduction  AROM;10 reps       Shoulder Exercises: Standing   Extension  Theraband;10 reps    Theraband Level (Shoulder Extension)  Level 2 (Red)    Row  Theraband;10 reps    Theraband Level (Shoulder Row)  Level 2 (Red)    Retraction  Theraband;10 reps    Theraband Level (Shoulder Retraction)  Level 2 (Red)      Shoulder Exercises: ROM/Strengthening   UBE (Upper Arm Bike)  level 1 3' reverse only   pace: 45-5.0     Manual Therapy   Manual Therapy  Myofascial release    Manual therapy comments  Manual therapy completed prior to exercises.     Myofascial Release  Myofascial release and manual stretching completed to left upper arm, trapezius, and scapularis region to decrease fascial restrictions and increase joint mobility in a pain free zone.              OT Education -  11/01/18 1620    Education Details  A/ROM shoulder exercises. Pt may stop previously provided exercises.     Person(s) Educated  Patient    Methods  Explanation;Demonstration;Verbal cues;Handout    Comprehension  Returned demonstration;Verbalized understanding       OT Short Term Goals - 11/01/18 1710      OT SHORT TERM GOAL #1   Title  patient will be educated and independent with a HEP to faciliate her progress in therapy and increase her ability to use her LUE as her non dominant with all daily tasks.    Time  4    Period  Weeks      OT SHORT TERM GOAL #2   Title  Patient will increase Left shoulder A/ROM to WFL/equal to right UE in order to be able to reach at or above shoulder level in order to complete normal daily tasks.     Time  4    Period  Weeks    Status  On-going      OT SHORT TERM GOAL #3   Title  Patient will increase her LUE strength to 4/5 in order to return to using her left UE for typical daily lifting tasks with increased comfort.     Time  4    Period  Weeks    Status  On-going      OT SHORT TERM GOAL #4   Title  patient will report a decrease of pain level in her left arm to 5/10 or less with use of  pain management techniques if needed in order to use her cane in either her Right or Left UE if needed.     Time  4    Period  Weeks    Status  On-going      OT SHORT TERM GOAL #5   Title  Patient will decrease fascial restrictions in left UE to minimal amount or less in order to increase functional mobility and comfort level in order to complete daily tasks.     Time  4    Period  Weeks    Status  On-going               Plan - 11/01/18 1707    Clinical Impression Statement  A: Patient was able to complete all seated A/ROM shoulder exercises. Added X to V arms, scapular theraband, and UBE bike. patient reports increased level of pain although states her pain tolerance is very high and she is able to achieve functional A/ROM. HEP was updated. VC were provided during session for form and technique.     Plan  P: Request more visits from medicaid to cover the end of the year. update Cert. Add Y arms lift off.     Consulted and Agree with Plan of Care  Patient       Patient will benefit from skilled therapeutic intervention in order to improve the following deficits and impairments:  Decreased coping skills, Decreased strength, Decreased activity tolerance, Pain, Decreased range of motion, Impaired UE functional use  Visit Diagnosis: Other symptoms and signs involving the musculoskeletal system  Chronic left shoulder pain  Stiffness of left shoulder, not elsewhere classified    Problem List There are no active problems to display for this patient.  Sue [name], OTR/L,CBIS  [phone]  11/01/2018, 5:13 PM  [facility] [address] [place], [place], [address] Phone: [phone]   Fax:  [phone]  Name: Sue [name] MRN:  [id number] Date of Birth: July 02, 1981

## 2018-11-04 ENCOUNTER — Encounter (HOSPITAL_COMMUNITY): Payer: Self-pay

## 2018-11-04 ENCOUNTER — Ambulatory Visit (HOSPITAL_COMMUNITY): Payer: Medicaid Other

## 2018-11-04 DIAGNOSIS — M25512 Pain in left shoulder: Secondary | ICD-10-CM

## 2018-11-04 DIAGNOSIS — G8929 Other chronic pain: Secondary | ICD-10-CM

## 2018-11-04 DIAGNOSIS — R29898 Other symptoms and signs involving the musculoskeletal system: Secondary | ICD-10-CM

## 2018-11-04 DIAGNOSIS — M25612 Stiffness of left shoulder, not elsewhere classified: Secondary | ICD-10-CM

## 2018-11-04 NOTE — Therapy (Signed)
[place], [place], [address] Phone: [phone]   Fax:  [phone]  Occupational Therapy Treatment Reassessment/discharge Patient Details  Name: Sue [name] MRN: [id number] Date of Birth: 1980-12-23 Referring Provider (OT): Barton Fanny, NP   Encounter Date: 11/04/2018  OT End of Session - 11/04/18 1025    Visit Number  7    Number of Visits  8    Authorization Type  Medicaid     Authorization Time Period  6 visits approved 12/2-12/22    Authorization - Visit Number  3    Authorization - Number of Visits  6    OT Start Time  0953    OT Stop Time  1030    OT Time Calculation (min)  37 min    Activity Tolerance  Patient tolerated treatment well    Behavior During Therapy  [facility] for tasks assessed/performed       History reviewed. No pertinent past medical history.  History reviewed. No pertinent surgical history.  There were no vitals filed for this visit.  Subjective Assessment - 11/04/18 1022    Subjective   S: I can move it a lot more than before and I'm able to hold my cat now.    Currently in Pain?  Yes    Pain Score  4     Pain Location  Shoulder    Pain Orientation  Left    Pain Descriptors / Indicators  Aching;Sore    Pain Type  Chronic pain    Pain Radiating Towards  N/A    Pain Onset  In the past 7 days    Pain Frequency  Intermittent    Aggravating Factors   cold    Pain Relieving Factors  heating pad, not moving it    Effect of Pain on Daily Activities  mod effect         [facility] OT Assessment - 11/04/18 1004      Assessment   Medical Diagnosis  Left shoulder pain    Referring Provider (OT)  Barton Fanny, NP      Precautions   Precautions  None      Palpation   Palpation comment  Min amount of fascial restrictions in left upper arm, trapezius, and scapularis region.       AROM   Overall AROM Comments  Assessed seated. IR/er adducted    AROM Assessment Site  Shoulder    Right/Left Shoulder  Left    Left Shoulder Flexion  165 Degrees   previous: 154   Left Shoulder ABduction  170 Degrees   previous: 165   Left Shoulder Internal Rotation  90 Degrees   previous: 90   Left Shoulder External Rotation  90 Degrees   previous: 84     PROM   Overall PROM   Within functional limits for tasks performed    PROM Assessment Site  Shoulder      Strength   Overall Strength Comments  Assessed seated. IR/er adducted    Strength Assessment Site  Shoulder    Right/Left Shoulder  Left    Left Shoulder Flexion  4/5   previous: 4/5   Left Shoulder ABduction  4/5   previous: 4-/5   Left Shoulder Internal Rotation  5/5   previous: 5/5   Left Shoulder External Rotation  5/5   previous: 5/5              OT Treatments/Exercises (OP) - 11/04/18 1023  Exercises   Exercises  Shoulder      Shoulder Exercises: Standing   Extension  Theraband;10 reps    Theraband Level (Shoulder Extension)  Level 2 (Red)    Row  Theraband;10 reps    Theraband Level (Shoulder Row)  Level 2 (Red)    Retraction  Theraband;10 reps    Theraband Level (Shoulder Retraction)  Level 2 (Red)      Shoulder Exercises: ROM/Strengthening   UBE (Upper Arm Bike)  level 1 3' reverse only   pace: 4.5-5.0            OT Education - 11/04/18 1024    Education Details  Reviewed progres in therapy. Looked at therapy goals. HEP reviewed.     Person(s) Educated  Patient    Methods  Explanation    Comprehension  Verbalized understanding       OT Short Term Goals - 11/04/18 1008      OT SHORT TERM GOAL #1   Title  patient will be educated and independent with a HEP to faciliate her progress in therapy and increase her ability to use her LUE as her non dominant with all daily tasks.    Time  4    Period  Weeks      OT SHORT TERM GOAL #2   Title  Patient will increase Left shoulder A/ROM to WFL/equal to right UE in order to be able to reach at or above shoulder level in order to  complete normal daily tasks.     Time  4    Period  Weeks    Status  Achieved      OT SHORT TERM GOAL #3   Title  Patient will increase her LUE strength to 4/5 in order to return to using her left UE for typical daily lifting tasks with increased comfort.     Time  4    Period  Weeks    Status  Achieved      OT SHORT TERM GOAL #4   Title  patient will report a decrease of pain level in her left arm to 5/10 or less with use of pain management techniques if needed in order to use her cane in either her Right or Left UE if needed.     Baseline  With pain medication, pain is controlled.    Time  4    Period  Weeks    Status  Achieved      OT SHORT TERM GOAL #5   Title  Patient will decrease fascial restrictions in left UE to minimal amount or less in order to increase functional mobility and comfort level in order to complete daily tasks.     Time  4    Period  Weeks    Status  Achieved               Plan - 11/04/18 1027    Clinical Impression Statement  A: Reassessment completed this date as Medicaid authorization period is ending. Patient has functional A/ROM of her left shoulder which is equal to her right. her strength is functional and equal to her right UE. Pain is continous although pain medication helps to control it. Pain intensity has changed since beginning therapy and is no longer sharp and severe. Pt repotrs that she is using her LUE for daily chores and household tasks and she is able to pick up and hold her cat now, which weighs apprximately 12 lbs. All therapy goals have been  met at this time. HEP was reviewed and remains appropriate. Pt agrees with recommendation.     Plan  P: D/C from OT services with HEP. Patient is to follow up with MD.     Consulted and Agree with Plan of Care  Patient       Patient will benefit from skilled therapeutic intervention in order to improve the following deficits and impairments:  Decreased coping skills, Decreased strength,  Decreased activity tolerance, Pain, Decreased range of motion, Impaired UE functional use  Visit Diagnosis: Other symptoms and signs involving the musculoskeletal system  Stiffness of left shoulder, not elsewhere classified  Chronic left shoulder pain    Problem List There are no active problems to display for this patient.   OCCUPATIONAL THERAPY DISCHARGE SUMMARY  Visits from Start of Care: 7  Current functional level related to goals / functional outcomes: See above   Remaining deficits: See above   Education / Equipment: See above Plan: Patient agrees to discharge.  Patient goals were met. Patient is being discharged due to meeting the stated rehab goals.  ?????        Ailene Ravel, OTR/L,CBIS  [phone]  11/04/2018, 10:53 AM  [facility] [address] [place], [place], [address] Phone: [phone]   Fax:  [phone]  Name: Sue [name] MRN: [id number] Date of Birth: 02-09-1981

## 2018-11-08 ENCOUNTER — Ambulatory Visit (HOSPITAL_COMMUNITY): Payer: Medicaid Other | Admitting: Specialist

## 2018-11-11 ENCOUNTER — Encounter (HOSPITAL_COMMUNITY): Payer: Medicaid Other

## 2019-05-18 ENCOUNTER — Other Ambulatory Visit: Payer: Self-pay | Admitting: Neurology

## 2019-05-18 DIAGNOSIS — G35 Multiple sclerosis: Secondary | ICD-10-CM

## 2019-06-16 ENCOUNTER — Other Ambulatory Visit: Payer: Self-pay

## 2019-06-16 ENCOUNTER — Ambulatory Visit
Admission: RE | Admit: 2019-06-16 | Discharge: 2019-06-16 | Disposition: A | Discharge status: Home / Self Care | Payer: Medicaid Other | Source: Ambulatory Visit | Attending: Neurology | Admitting: Neurology

## 2019-06-16 DIAGNOSIS — G35 Multiple sclerosis: Secondary | ICD-10-CM

## 2021-01-23 ENCOUNTER — Other Ambulatory Visit (HOSPITAL_BASED_OUTPATIENT_CLINIC_OR_DEPARTMENT_OTHER): Payer: Self-pay

## 2021-01-23 DIAGNOSIS — G4733 Obstructive sleep apnea (adult) (pediatric): Secondary | ICD-10-CM

## 2021-02-10 ENCOUNTER — Other Ambulatory Visit: Payer: Self-pay

## 2021-02-10 ENCOUNTER — Ambulatory Visit: Payer: Medicaid Other | Attending: Neurology | Admitting: Neurology

## 2021-02-10 DIAGNOSIS — G4733 Obstructive sleep apnea (adult) (pediatric): Secondary | ICD-10-CM | POA: Diagnosis present

## 2021-02-12 NOTE — Procedures (Signed)
[facility] Kinley Ferrentino A. Gerilyn Pilgrim, MD     [email or web address]             NOCTURNAL POLYSOMNOGRAPHY   LOCATION: [facility]  Patient Name: Sue [name], Sue [name] Date: 02/10/2021 Gender: Female D.O.B: [birth year] Age (years): 39 Referring Provider: Jorge Mandril NP Height (inches): 64 Interpreting Physician: Beryle Beams MD, ABSM Weight (lbs): 336 RPSGT: Alfonso Ellis BMI: 58 MRN: [id number] Neck Size: 17.50 CLINICAL INFORMATION Sleep Study Type: NPSG     Indication for sleep study: N/A     Epworth Sleepiness Score: 6     SLEEP STUDY TECHNIQUE As per the AASM Manual for the Scoring of Sleep and Associated Events v2.3 (April 2016) with a hypopnea requiring 4% desaturations.  The channels recorded and monitored were frontal, central and occipital EEG, electrooculogram (EOG), submentalis EMG (chin), nasal and oral airflow, thoracic and abdominal wall motion, anterior tibialis EMG, snore microphone, electrocardiogram, and pulse oximetry.  MEDICATIONS Medications self-administered by patient taken the night of the study : N/A  Current Outpatient Medications:  .  atorvastatin (LIPITOR) 20 MG tablet, Take 20 mg by mouth daily., Disp: , Rfl:  .  famotidine (PEPCID) 40 MG tablet, Take 40 mg by mouth daily., Disp: , Rfl:  .  gabapentin (NEURONTIN) 600 MG tablet, Take 600 mg by mouth 3 (three) times daily., Disp: , Rfl:  .  HYDROcodone-acetaminophen (NORCO/VICODIN) 5-325 MG tablet, Take 1 tablet by mouth every 4 (four) hours as needed for moderate pain., Disp: , Rfl:  .  naproxen (NAPROSYN) 500 MG tablet, Take 500 mg by mouth 2 (two) times daily with a meal., Disp: , Rfl:  .  OXcarbazepine (TRILEPTAL) 150 MG tablet, Take 150 mg by mouth 2 (two) times daily., Disp: , Rfl:  .  pantoprazole (PROTONIX) 20 MG tablet, Take 20 mg by mouth daily., Disp: , Rfl:  .  rizatriptan (MAXALT) 10 MG tablet, Take 10 mg by mouth as needed for migraine. May repeat in 2 hours if needed,  Disp: , Rfl:  .  topiramate (TOPAMAX) 100 MG tablet, Take 100 mg by mouth 2 (two) times daily., Disp: , Rfl:  .  Vitamin D, Ergocalciferol, (DRISDOL) 1.25 MG (50000 UT) CAPS capsule, Take 50,000 Units by mouth every 7 (seven) days., Disp: , Rfl:      SLEEP ARCHITECTURE The study was initiated at 10:43:32 PM and ended at 5:28:29 AM.  Sleep onset time was 5.8 minutes and the sleep efficiency was 89.4%. The total sleep time was 362.1 minutes.  Stage REM latency was N/A minutes.  The patient spent 2.21% of the night in stage N1 sleep, 67.97% in stage N2 sleep, 29.82% in stage N3 and 0% in REM.  Alpha intrusion was absent.  Supine sleep was 47.08%.  RESPIRATORY PARAMETERS The overall apnea/hypopnea index (AHI) was 48.9 per hour. There were 6 total apneas, including 5 obstructive, 1 central and 0 mixed apneas. There were 289 hypopneas and 0 RERAs.  The AHI during Stage REM sleep was N/A per hour.  AHI while supine was 21.8 per hour.  The mean oxygen saturation was 92.15%. The minimum SpO2 during sleep was 83.00%.  loud snoring was noted during this study.  CARDIAC DATA The 2 lead EKG demonstrated sinus rhythm. The mean heart rate was 58.57 beats per minute. Other EKG findings include: None. LEG MOVEMENT DATA The total PLMS were 0 with a resulting PLMS index of 0.00. Associated arousal with leg movement index was 0.0.  IMPRESSIONS 1.  Severe obstructive sleep apnea syndrome is  documented with this study. Auto Pap 8-15 is recommended.  2. 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     Argie Ramming, MD Diplomate, American Board of Sleep Medicine.   ELECTRONICALLY SIGNED ON:  02/12/2021, 5:47 PM [facility] PH: [phone]   FX: [phone] ACCREDITED BY THE AMERICAN ACADEMY OF SLEEP MEDICINE

## 2021-04-12 ENCOUNTER — Other Ambulatory Visit (HOSPITAL_COMMUNITY): Payer: Self-pay | Admitting: Neurology

## 2021-04-12 DIAGNOSIS — G40909 Epilepsy, unspecified, not intractable, without status epilepticus: Secondary | ICD-10-CM

## 2021-04-25 ENCOUNTER — Other Ambulatory Visit: Payer: Self-pay

## 2021-04-25 ENCOUNTER — Ambulatory Visit (HOSPITAL_COMMUNITY)
Admission: RE | Admit: 2021-04-25 | Discharge: 2021-04-25 | Disposition: A | Discharge status: Home / Self Care | Payer: Medicaid Other | Source: Ambulatory Visit | Attending: Neurology | Admitting: Neurology

## 2021-04-25 DIAGNOSIS — G40909 Epilepsy, unspecified, not intractable, without status epilepticus: Secondary | ICD-10-CM | POA: Diagnosis present

## 2021-04-25 MED ORDER — GADOBUTROL 1 MMOL/ML IV SOLN
10.0000 mL | Freq: Once | INTRAVENOUS | Status: AC | PRN
  Administered 2021-04-25: 10 mL via INTRAVENOUS

## 2022-03-21 IMAGING — MR MR HEAD WO/W CM
15 of 16 series · 39 of 48 positions shown · IV contrast (gadavist)
Comparison: None.

CLINICAL DATA: Seizure disorder.

EXAM:
MRI HEAD WITHOUT AND WITH CONTRAST
TECHNIQUE: Multiplanar, multiecho pulse sequences of the brain and surrounding
structures were obtained without and with intravenous contrast.
CONTRAST:  10mL GADAVIST GADOBUTROL 1 MMOL/ML IV SOLN

[Series 5: DWI · axial · 3.0mm · 0.77mm/px · z∈[-25,+122]mm · 2 of 50 slices shown (1 of 4)]
[im 1/50]
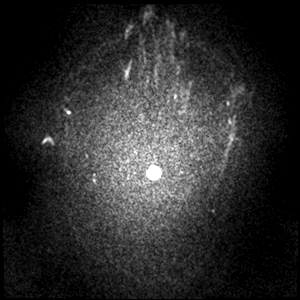
[im 50/50]
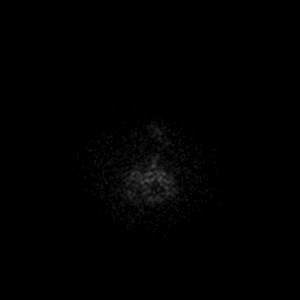

[Series 6: DWI · axial · 3.0mm · 0.77mm/px · z∈[-25,+122]mm · 2 of 50 slices shown (2 of 4)]
[im 1/50]
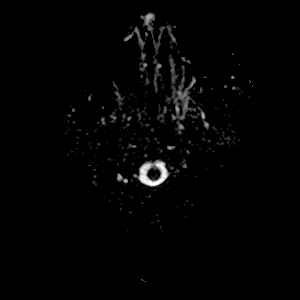
[im 50/50]
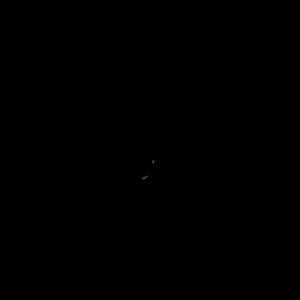

[Series 7: DWI · coronal · 5.0mm · 0.88mm/px · 2 of 28 slices shown (3 of 4)]
[im 1/28]
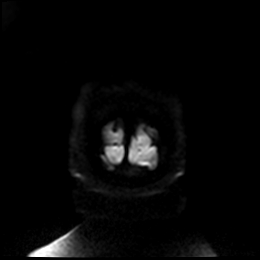
[im 28/28]
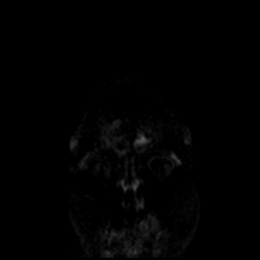

[Series 8: DWI · coronal · 5.0mm · 0.88mm/px · 2 of 28 slices shown (4 of 4)]
[im 1/28]
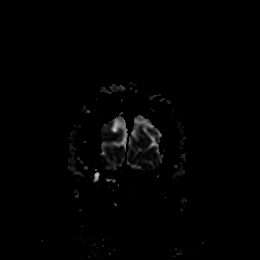
[im 28/28]
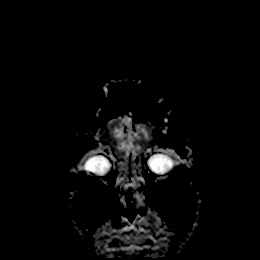

[Series 9: T1 · sagittal · 5.0mm · 0.75mm/px · 1 of 19 slices shown (1 of 2)]
[im 1/19]
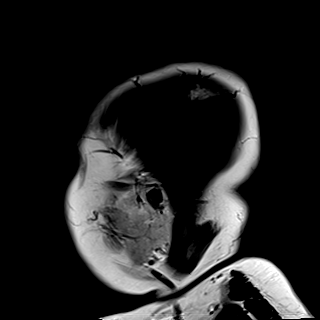

[Series 10: T2 · axial · 5.0mm · 0.72mm/px · 1 of 23 slices shown (1 of 2)]
[im 1/23]
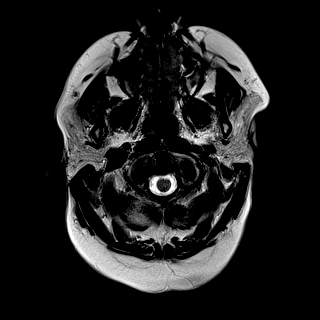

[Series 11: mag_images · axial · 3.0mm · 0.90mm/px · z∈[-40,+137]mm · 3 of 60 slices shown]
[im 1/60]
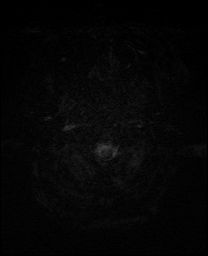
[im 30/60]
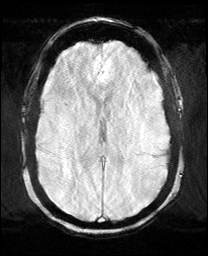
[im 60/60]
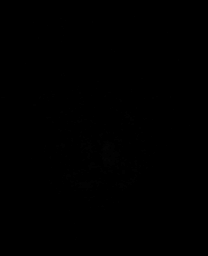

[Series 12: pha_images · axial · 3.0mm · 0.90mm/px · z∈[-40,+137]mm · 3 of 60 slices shown]
[im 1/60]
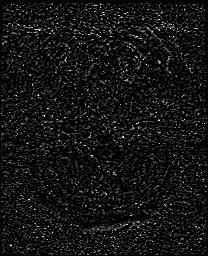
[im 30/60]
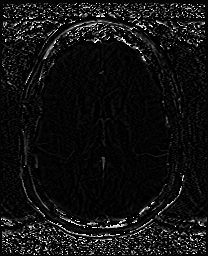
[im 60/60]
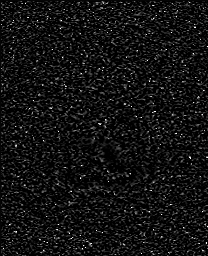

[Series 13: swi_images · axial · 3.0mm · 0.90mm/px · z∈[-40,+137]mm · 3 of 60 slices shown]
[im 1/60]
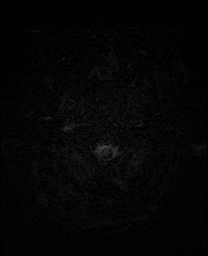
[im 30/60]
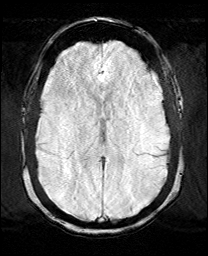
[im 60/60]
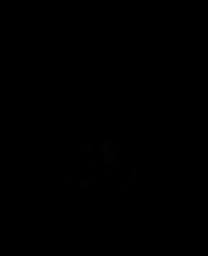

[Series 15: FLAIR · axial · 3.0mm · 0.45mm/px · z∈[-25,+122]mm · 3 of 50 slices shown (1 of 2)]
[im 1/50]
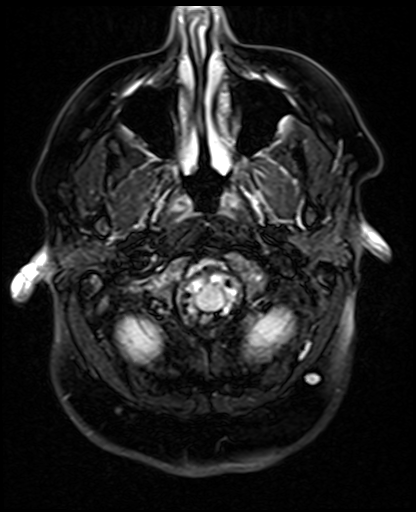
[im 25/50]
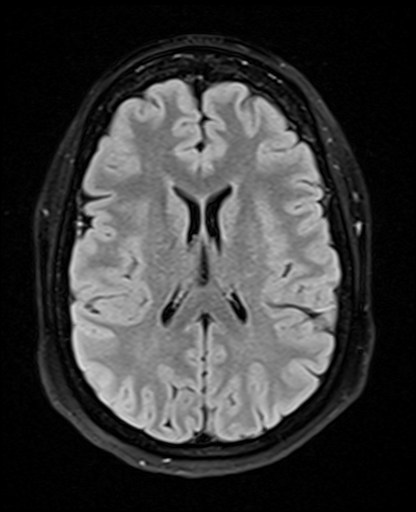
[im 50/50]
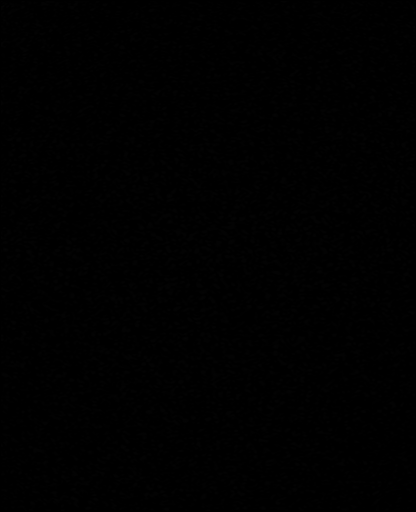

[Series 16: T1 · axial · 1.0mm · 0.98mm/px · z∈[-39,+132]mm · 9 of 163 slices shown (2 of 2)]
[im 1/163]
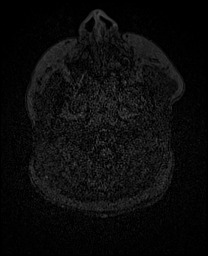
[im 21/163]
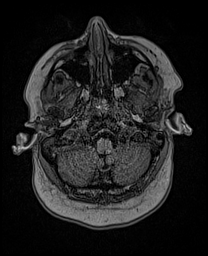
[im 41/163]
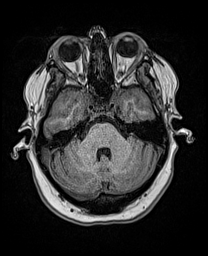
[im 61/163]
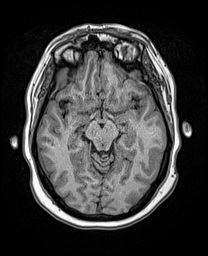
[im 82/163]
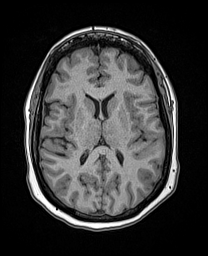
[im 102/163]
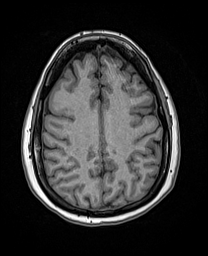
[im 122/163]
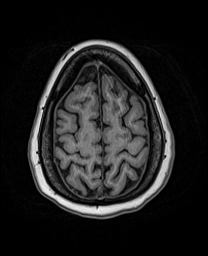
[im 142/163]
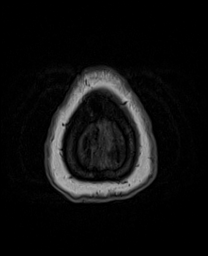
[im 163/163]
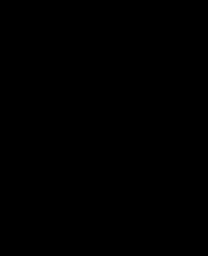

[Series 17: FLAIR · coronal · 3.0mm · 0.56mm/px · 2 of 28 slices shown (2 of 2)]
[im 1/28]
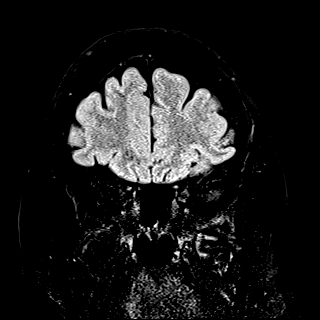
[im 28/28]
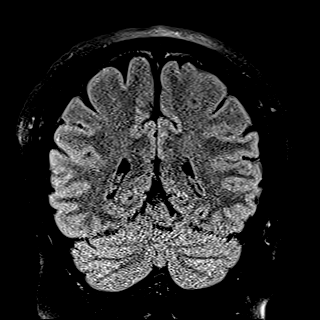

[Series 18: T2 · coronal · 3.0mm · 0.28mm/px · 2 of 28 slices shown (2 of 2)]
[im 1/28]
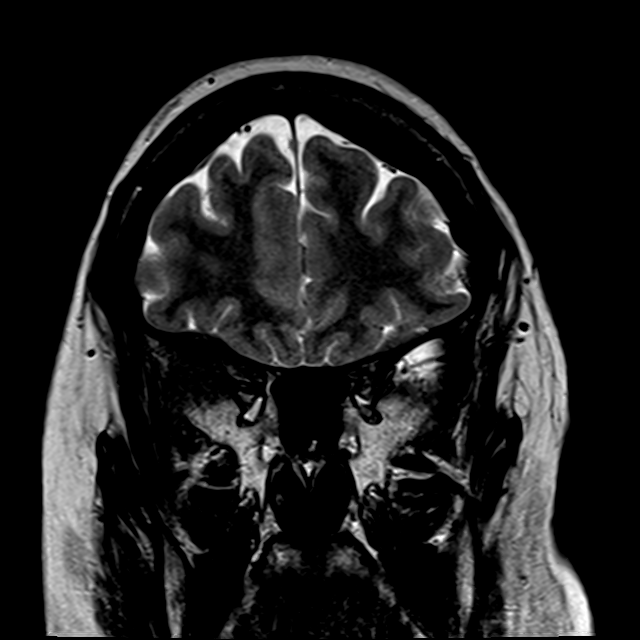
[im 28/28]
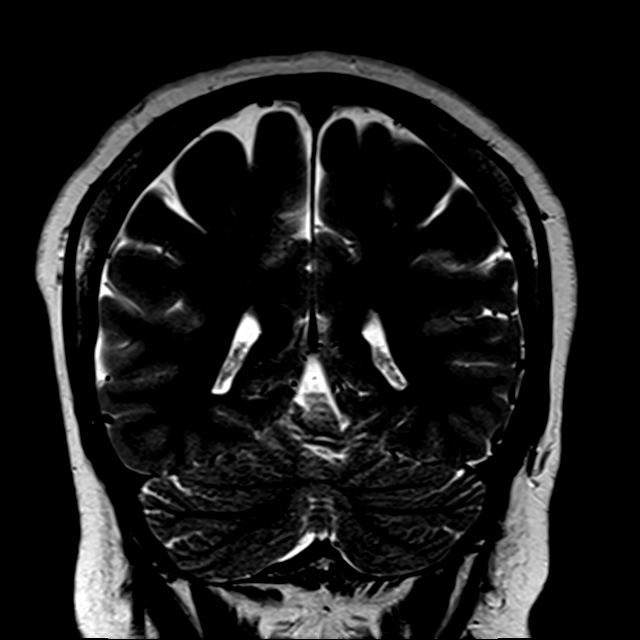

[Series 19: T2 post-contrast · coronal · 5.0mm · 0.72mm/px · 2 of 28 slices shown]
[im 1/28]
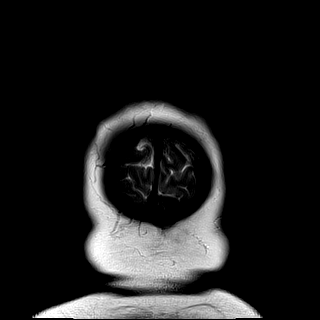
[im 28/28]
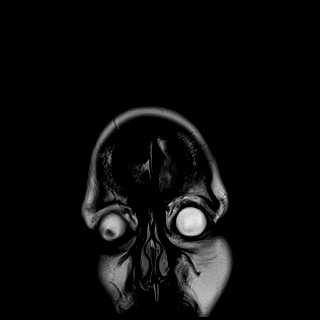

[Series 21: T1 post-contrast · coronal · 5.0mm · 0.34mm/px · 2 of 28 slices shown]
[im 1/28]
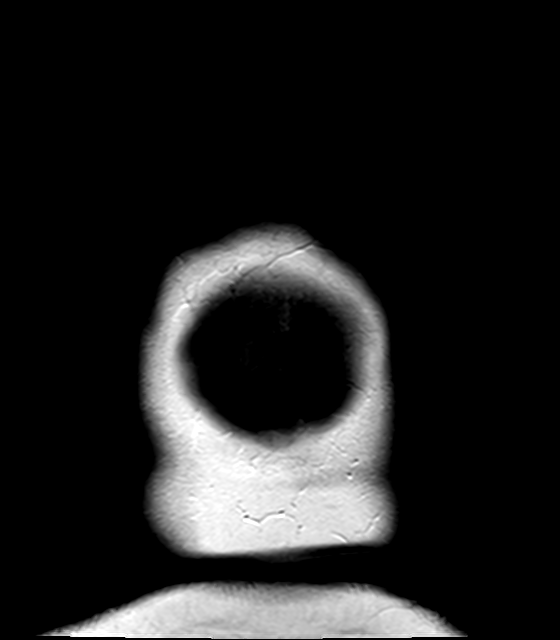
[im 28/28]
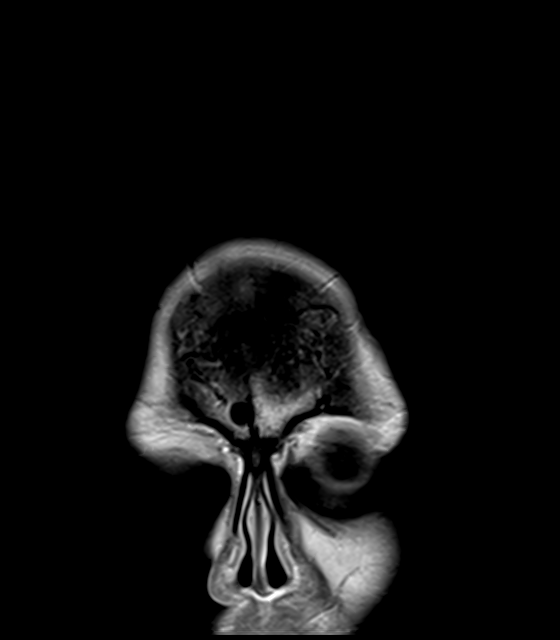

[39 of 48 positions shown; findings below may reference images not displayed]

FINDINGS: Brain: No acute infarction, hemorrhage, hydrocephalus, extra-axial
collection or mass lesion. The brain parenchyma has normal
morphology and signal characteristics. Mesial temporal lobes appear
preserved. No focus of abnormal contrast enhancement.

Vascular: Normal flow voids.

Skull and upper cervical spine: Normal marrow signal.

Sinuses/Orbits: Negative.
IMPRESSION: Unremarkable MRI of the brain.

## 2023-04-09 ENCOUNTER — Encounter (INDEPENDENT_AMBULATORY_CARE_PROVIDER_SITE_OTHER): Payer: Self-pay | Admitting: *Deleted

## 2023-06-09 ENCOUNTER — Ambulatory Visit (INDEPENDENT_AMBULATORY_CARE_PROVIDER_SITE_OTHER): Payer: Medicaid Other | Admitting: Gastroenterology

## 2023-06-09 ENCOUNTER — Encounter (INDEPENDENT_AMBULATORY_CARE_PROVIDER_SITE_OTHER): Payer: Self-pay | Admitting: *Deleted

## 2023-08-31 NOTE — H&P (Signed)
  Patient: Gladine Plude  PID: [id number]  DOB: 1981-09-08  SEX: Female   Patient referred by DDS for extraction teeth# 2, 4, 5, 7, 10, 12, 15, 20, 30.  CC: Bad teeth.  Past Medical History:  Asthma, Hay Fever or Sinus Problems, Snoring, OSA with CPAP, Smoker, Bruise Easily, Hypothyroid, Peptic Ulcer Disease, Mental Health problems, dieting, Morbid Obesity    Medications: Gabapentin, Naproxen, Atorvastatin, Famotidine, Tizanidine, topiramate, Propranolol, Pantoprazole SOD DR, Levothyroxine    Allergies:     NKDA    Surgeries:   breast reduction, Hysterectomy     Social History       Smoking:  yes          Alcohol: Drug use:                             Exam: BMI 57. Caries teeth # 2, 4, 5, 7, 10, 12, 15, 20, 30.  No purulence, edema, fluctuance, trismus. Oral cancer screening negative. Pharynx clear. No lymphadenopathy.  Panorex: Caries teeth# 2, 4, 5, 7, 10, 12, 15, 20, 30.  Assessment: ASA 3. Non-restorable   teeth# 2, 4, 5, 7, 10, 12, 15, 20, 30.            Plan: Extraction Teeth # 2, 4, 5, 7, 10, 12, 15, 20, 30.  Hospital Day surgery.                 Rx: n               Risks and complications explained. Questions answered.   Georgia Lopes, DMD

## 2023-09-02 ENCOUNTER — Other Ambulatory Visit: Payer: Self-pay

## 2023-09-02 ENCOUNTER — Encounter (HOSPITAL_COMMUNITY): Payer: Self-pay | Admitting: Oral Surgery

## 2023-09-02 NOTE — Progress Notes (Signed)
PCP - New patient visit end of month with new MD(patient not sure of name) Cardiologist -   PPM/ICD - denies Device Orders - n/a Rep Notified - n/a  Chest x-ray - denies EKG - 01-21-21 Stress Test - denies ECHO - denies Cardiac Cath - denies  CPAP - Yes, does not use at times Sleep test 03-21-21  DM denies  Blood Thinner Instructions: Denies Aspirin Instructions: n/a  ERAS Protcol - NPO  COVID TEST- no  Anesthesia review: yes, HX of asthma, OSA  Patient verbally denies any shortness of breath, fever, cough and chest pain during phone call   -------------  SDW INSTRUCTIONS given:  Your procedure is scheduled on September 04, 2023.  Report to [facility] Main Entrance "A" at 6:15 A.M., and check in at the Admitting office.  Call this number if you have problems the morning of surgery:  [phone]   Remember:  Do not eat or drink after midnight the night before your surgery     Take these medicines the morning of surgery with A SIP OF WATER  atorvastatin (LIPITOR)  gabapentin (NEURONTIN)  levothyroxine (SYNTHROID)  omeprazole (PRILOSEC OTC)  OXcarbazepine (TRILEPTAL)  pantoprazole (PROTONIX)   IF NEEDED cetirizine (ZYRTEC)  Fluticasone Propionate, Inhal, (FLUTICASONE PROPIONATE DISKUS)    As of today, STOP taking any Aspirin (unless otherwise instructed by your surgeon) Aleve, Naproxen, Ibuprofen, Motrin, Advil, Goody's, BC's, all herbal medications, fish oil, and all vitamins.                      Do not wear jewelry, make up, or nail polish            Do not wear lotions, powders, perfumes/colognes, or deodorant.            Do not shave 48 hours prior to surgery.  Men may shave face and neck.            Do not bring valuables to the hospital.            [facility] is not responsible for any belongings or valuables.  Do NOT Smoke (Tobacco/Vaping) 24 hours prior to your procedure If you use a CPAP at night, you may bring all equipment for your overnight  stay.   Contacts, glasses, dentures or bridgework may not be worn into surgery.      For patients admitted to the hospital, discharge time will be determined by your treatment team.   Patients discharged the day of surgery will not be allowed to drive home, and someone needs to stay with them for 24 hours.    Special instructions:   [facility]- Preparing For Surgery  Before surgery, you can play an important role. Because skin is not sterile, your skin needs to be as free of germs as possible. You can reduce the number of germs on your skin by washing with CHG (chlorahexidine gluconate) Soap before surgery.  CHG is an antiseptic cleaner which kills germs and bonds with the skin to continue killing germs even after washing.    Oral Hygiene is also important to reduce your risk of infection.  Remember - BRUSH YOUR TEETH THE MORNING OF SURGERY WITH YOUR REGULAR TOOTHPASTE  Please do not use if you have an allergy to CHG or antibacterial soaps. If your skin becomes reddened/irritated stop using the CHG.  Do not shave (including legs and underarms) for at least 48 hours prior to first CHG shower. It is OK to shave  your face.  Please follow these instructions carefully.   Shower the NIGHT BEFORE SURGERY and the MORNING OF SURGERY with DIAL Soap.   Pat yourself dry with a CLEAN TOWEL.  Wear CLEAN PAJAMAS to bed the night before surgery  Place CLEAN SHEETS on your bed the night of your first shower and DO NOT SLEEP WITH PETS.   Day of Surgery: Please shower morning of surgery  Wear Clean/Comfortable clothing the morning of surgery Do not apply any deodorants/lotions.   Remember to brush your teeth WITH YOUR REGULAR TOOTHPASTE.   Questions were answered. Patient verbalized understanding of instructions.

## 2023-09-04 ENCOUNTER — Ambulatory Visit (HOSPITAL_COMMUNITY): Payer: MEDICAID | Admitting: Physician Assistant

## 2023-09-04 ENCOUNTER — Ambulatory Visit (HOSPITAL_COMMUNITY)
Admission: RE | Admit: 2023-09-04 | Discharge: 2023-09-04 | Disposition: A | Discharge status: Home / Self Care | Payer: MEDICAID | Attending: Oral Surgery | Admitting: Oral Surgery

## 2023-09-04 ENCOUNTER — Encounter (HOSPITAL_COMMUNITY)
Admission: RE | Disposition: A | Discharge status: Home / Self Care | Payer: Self-pay | Source: Home / Self Care | Attending: Oral Surgery

## 2023-09-04 ENCOUNTER — Encounter (HOSPITAL_COMMUNITY): Payer: Self-pay | Admitting: Oral Surgery

## 2023-09-04 ENCOUNTER — Ambulatory Visit (HOSPITAL_BASED_OUTPATIENT_CLINIC_OR_DEPARTMENT_OTHER): Payer: MEDICAID | Admitting: Physician Assistant

## 2023-09-04 ENCOUNTER — Other Ambulatory Visit: Payer: Self-pay

## 2023-09-04 DIAGNOSIS — K029 Dental caries, unspecified: Secondary | ICD-10-CM | POA: Diagnosis present

## 2023-09-04 DIAGNOSIS — R519 Headache, unspecified: Secondary | ICD-10-CM | POA: Insufficient documentation

## 2023-09-04 DIAGNOSIS — E039 Hypothyroidism, unspecified: Secondary | ICD-10-CM | POA: Insufficient documentation

## 2023-09-04 DIAGNOSIS — Z8711 Personal history of peptic ulcer disease: Secondary | ICD-10-CM | POA: Diagnosis not present

## 2023-09-04 DIAGNOSIS — F32A Depression, unspecified: Secondary | ICD-10-CM | POA: Diagnosis not present

## 2023-09-04 DIAGNOSIS — J45909 Unspecified asthma, uncomplicated: Secondary | ICD-10-CM | POA: Insufficient documentation

## 2023-09-04 DIAGNOSIS — M797 Fibromyalgia: Secondary | ICD-10-CM | POA: Diagnosis not present

## 2023-09-04 DIAGNOSIS — Z6841 Body Mass Index (BMI) 40.0 and over, adult: Secondary | ICD-10-CM | POA: Insufficient documentation

## 2023-09-04 DIAGNOSIS — F419 Anxiety disorder, unspecified: Secondary | ICD-10-CM | POA: Diagnosis not present

## 2023-09-04 DIAGNOSIS — G4733 Obstructive sleep apnea (adult) (pediatric): Secondary | ICD-10-CM | POA: Insufficient documentation

## 2023-09-04 DIAGNOSIS — K219 Gastro-esophageal reflux disease without esophagitis: Secondary | ICD-10-CM | POA: Insufficient documentation

## 2023-09-04 DIAGNOSIS — Z87891 Personal history of nicotine dependence: Secondary | ICD-10-CM | POA: Diagnosis not present

## 2023-09-04 HISTORY — DX: Hypothyroidism, unspecified: E03.9

## 2023-09-04 HISTORY — DX: Headache, unspecified: R51.9

## 2023-09-04 HISTORY — DX: Sleep apnea, unspecified: G47.30

## 2023-09-04 HISTORY — DX: Unspecified asthma, uncomplicated: J45.909

## 2023-09-04 HISTORY — DX: Depression, unspecified: F32.A

## 2023-09-04 HISTORY — PX: TOOTH EXTRACTION: SHX859

## 2023-09-04 HISTORY — DX: Fibromyalgia: M79.7

## 2023-09-04 HISTORY — DX: Anxiety disorder, unspecified: F41.9

## 2023-09-04 HISTORY — DX: Gastro-esophageal reflux disease without esophagitis: K21.9

## 2023-09-04 LAB — CBC
HCT: 39.7 % (ref 36.0–46.0)
Hemoglobin: 12.5 g/dL (ref 12.0–15.0)
MCH: 27.1 pg (ref 26.0–34.0)
MCHC: 31.5 g/dL (ref 30.0–36.0)
MCV: 86.1 fL (ref 80.0–100.0)
Platelets: 215 10*3/uL (ref 150–400)
RBC: 4.61 MIL/uL (ref 3.87–5.11)
RDW: 14 % (ref 11.5–15.5)
WBC: 5.1 10*3/uL (ref 4.0–10.5)
nRBC: 0 % (ref 0.0–0.2)

## 2023-09-04 SURGERY — DENTAL RESTORATION/EXTRACTIONS
Anesthesia: General

## 2023-09-04 MED ORDER — DEXAMETHASONE SODIUM PHOSPHATE 10 MG/ML IJ SOLN
INTRAMUSCULAR | Status: DC | PRN
  Administered 2023-09-04: 10 mg via INTRAVENOUS

## 2023-09-04 MED ORDER — PROPOFOL 10 MG/ML IV BOLUS
INTRAVENOUS | Status: AC
  Filled 2023-09-04: qty 20

## 2023-09-04 MED ORDER — ONDANSETRON HCL 4 MG/2ML IJ SOLN
4.0000 mg | Freq: Once | INTRAMUSCULAR | Status: AC | PRN
  Administered 2023-09-04: 4 mg via INTRAVENOUS

## 2023-09-04 MED ORDER — KETAMINE HCL 50 MG/5ML IJ SOSY
PREFILLED_SYRINGE | INTRAMUSCULAR | Status: DC | PRN
Start: 2023-09-04 — End: 2023-09-04
  Administered 2023-09-04: 20 mg via INTRAVENOUS

## 2023-09-04 MED ORDER — ROCURONIUM BROMIDE 10 MG/ML (PF) SYRINGE
PREFILLED_SYRINGE | INTRAVENOUS | Status: DC | PRN
  Administered 2023-09-04: 50 mg via INTRAVENOUS

## 2023-09-04 MED ORDER — HYDROCODONE-ACETAMINOPHEN 5-325 MG PO TABS: 1.0000 | ORAL_TABLET | ORAL | 0 refills | Status: DC | PRN

## 2023-09-04 MED ORDER — CHLORHEXIDINE GLUCONATE 0.12 % MT SOLN
15.0000 mL | Freq: Once | OROMUCOSAL | Status: AC
  Administered 2023-09-04: 15 mL via OROMUCOSAL
  Filled 2023-09-04: qty 15

## 2023-09-04 MED ORDER — OXYMETAZOLINE HCL 0.05 % NA SOLN
NASAL | Status: AC
  Filled 2023-09-04: qty 30

## 2023-09-04 MED ORDER — FENTANYL CITRATE (PF) 250 MCG/5ML IJ SOLN
INTRAMUSCULAR | Status: DC | PRN
  Administered 2023-09-04: 50 ug via INTRAVENOUS
  Administered 2023-09-04: 100 ug via INTRAVENOUS
  Administered 2023-09-04: 50 ug via INTRAVENOUS

## 2023-09-04 MED ORDER — LIDOCAINE 2% (20 MG/ML) 5 ML SYRINGE
INTRAMUSCULAR | Status: DC | PRN
  Administered 2023-09-04: 40 mg via INTRAVENOUS

## 2023-09-04 MED ORDER — MIDAZOLAM HCL 2 MG/2ML IJ SOLN
INTRAMUSCULAR | Status: AC
  Filled 2023-09-04: qty 2

## 2023-09-04 MED ORDER — LIDOCAINE-EPINEPHRINE 2 %-1:100000 IJ SOLN
INTRAMUSCULAR | Status: AC
  Filled 2023-09-04: qty 1

## 2023-09-04 MED ORDER — FENTANYL CITRATE (PF) 100 MCG/2ML IJ SOLN: 25.0000 ug | INTRAMUSCULAR | Status: DC | PRN

## 2023-09-04 MED ORDER — KETOROLAC TROMETHAMINE 30 MG/ML IJ SOLN
INTRAMUSCULAR | Status: AC
  Filled 2023-09-04: qty 1

## 2023-09-04 MED ORDER — KETAMINE HCL 50 MG/5ML IJ SOSY
PREFILLED_SYRINGE | INTRAMUSCULAR | Status: AC
  Filled 2023-09-04: qty 10

## 2023-09-04 MED ORDER — LIDOCAINE 2% (20 MG/ML) 5 ML SYRINGE
INTRAMUSCULAR | Status: AC
  Filled 2023-09-04: qty 5

## 2023-09-04 MED ORDER — DROPERIDOL 2.5 MG/ML IJ SOLN: 0.6250 mg | Freq: Once | INTRAMUSCULAR | Status: DC | PRN

## 2023-09-04 MED ORDER — SUCCINYLCHOLINE CHLORIDE 200 MG/10ML IV SOSY
PREFILLED_SYRINGE | INTRAVENOUS | Status: DC | PRN
  Administered 2023-09-04: 100 mg via INTRAVENOUS

## 2023-09-04 MED ORDER — GLYCOPYRROLATE PF 0.2 MG/ML IJ SOSY
PREFILLED_SYRINGE | INTRAMUSCULAR | Status: AC
  Filled 2023-09-04: qty 1

## 2023-09-04 MED ORDER — GLYCOPYRROLATE PF 0.2 MG/ML IJ SOSY
PREFILLED_SYRINGE | INTRAMUSCULAR | Status: DC | PRN
  Administered 2023-09-04: .2 mg via INTRAVENOUS

## 2023-09-04 MED ORDER — LIDOCAINE-EPINEPHRINE 2 %-1:100000 IJ SOLN
INTRAMUSCULAR | Status: DC | PRN
Start: 2023-09-04 — End: 2023-09-04
  Administered 2023-09-04: 17 mL

## 2023-09-04 MED ORDER — DEXAMETHASONE SODIUM PHOSPHATE 10 MG/ML IJ SOLN
INTRAMUSCULAR | Status: AC
  Filled 2023-09-04: qty 1

## 2023-09-04 MED ORDER — LACTATED RINGERS IV SOLN: INTRAVENOUS | Status: DC

## 2023-09-04 MED ORDER — SODIUM CHLORIDE 0.9 % IV SOLN
INTRAVENOUS | Status: DC | PRN
Start: 2023-09-04 — End: 2023-09-04

## 2023-09-04 MED ORDER — OXYMETAZOLINE HCL 0.05 % NA SOLN
NASAL | Status: DC | PRN
Start: 2023-09-04 — End: 2023-09-04
  Administered 2023-09-04: 3 via NASAL

## 2023-09-04 MED ORDER — KETOROLAC TROMETHAMINE 30 MG/ML IJ SOLN
INTRAMUSCULAR | Status: DC | PRN
Start: 2023-09-04 — End: 2023-09-04
  Administered 2023-09-04: 30 mg via INTRAVENOUS

## 2023-09-04 MED ORDER — MIDAZOLAM HCL 2 MG/2ML IJ SOLN
INTRAMUSCULAR | Status: DC | PRN
  Administered 2023-09-04 (×2): 1 mg via INTRAVENOUS

## 2023-09-04 MED ORDER — AMOXICILLIN 500 MG PO CAPS: 500.0000 mg | ORAL_CAPSULE | Freq: Three times a day (TID) | ORAL | 0 refills | Status: DC

## 2023-09-04 MED ORDER — SUGAMMADEX SODIUM 200 MG/2ML IV SOLN
INTRAVENOUS | Status: DC | PRN
  Administered 2023-09-04: 300 mg via INTRAVENOUS

## 2023-09-04 MED ORDER — FENTANYL CITRATE (PF) 250 MCG/5ML IJ SOLN
INTRAMUSCULAR | Status: AC
  Filled 2023-09-04: qty 5

## 2023-09-04 MED ORDER — ONDANSETRON HCL 4 MG/2ML IJ SOLN
INTRAMUSCULAR | Status: AC
  Filled 2023-09-04: qty 2

## 2023-09-04 MED ORDER — CEFAZOLIN IN SODIUM CHLORIDE 3-0.9 GM/100ML-% IV SOLN
3.0000 g | INTRAVENOUS | Status: AC
  Administered 2023-09-04: 3 g via INTRAVENOUS
  Filled 2023-09-04: qty 100

## 2023-09-04 MED ORDER — SODIUM CHLORIDE 0.9 % IR SOLN
Status: DC | PRN
  Administered 2023-09-04: 100 mL

## 2023-09-04 MED ORDER — 0.9 % SODIUM CHLORIDE (POUR BTL) OPTIME
TOPICAL | Status: DC | PRN
  Administered 2023-09-04: 1000 mL

## 2023-09-04 MED ORDER — SUCCINYLCHOLINE CHLORIDE 200 MG/10ML IV SOSY
PREFILLED_SYRINGE | INTRAVENOUS | Status: AC
  Filled 2023-09-04: qty 10

## 2023-09-04 MED ORDER — ORAL CARE MOUTH RINSE: 15.0000 mL | Freq: Once | OROMUCOSAL | Status: AC

## 2023-09-04 MED ORDER — PROPOFOL 10 MG/ML IV BOLUS
INTRAVENOUS | Status: DC | PRN
  Administered 2023-09-04: 200 mg via INTRAVENOUS

## 2023-09-04 MED ORDER — LIDOCAINE-EPINEPHRINE 2 %-1:100000 IJ SOLN
INTRAMUSCULAR | Status: AC
  Filled 2023-09-04: qty 3.4

## 2023-09-04 SURGICAL SUPPLY — 36 items
BAG COUNTER SPONGE SURGICOUNT (BAG) IMPLANT
BAG SPNG CNTER NS LX DISP (BAG)
BLADE SURG 15 STRL LF DISP TIS (BLADE) ×1 IMPLANT
BLADE SURG 15 STRL SS (BLADE) ×1
BUR CROSS CUT FISSURE 1.6 (BURR) ×1 IMPLANT
BUR EGG ELITE 4.0 (BURR) ×1 IMPLANT
CANISTER SUCT 3000ML PPV (MISCELLANEOUS) ×1 IMPLANT
COVER SURGICAL LIGHT HANDLE (MISCELLANEOUS) ×1 IMPLANT
GAUZE PACKING FOLDED 2 STR (GAUZE/BANDAGES/DRESSINGS) ×1 IMPLANT
GLOVE BIO SURGEON STRL SZ 6.5 (GLOVE) IMPLANT
GLOVE BIO SURGEON STRL SZ7 (GLOVE) IMPLANT
GLOVE BIO SURGEON STRL SZ8 (GLOVE) ×1 IMPLANT
GLOVE BIOGEL PI IND STRL 6.5 (GLOVE) IMPLANT
GLOVE BIOGEL PI IND STRL 7.0 (GLOVE) IMPLANT
GOWN STRL REUS W/ TWL LRG LVL3 (GOWN DISPOSABLE) ×1 IMPLANT
GOWN STRL REUS W/ TWL XL LVL3 (GOWN DISPOSABLE) ×1 IMPLANT
GOWN STRL REUS W/TWL LRG LVL3 (GOWN DISPOSABLE) ×1
GOWN STRL REUS W/TWL XL LVL3 (GOWN DISPOSABLE) ×1
IV NS 1000ML (IV SOLUTION)
IV NS 1000ML BAXH (IV SOLUTION) ×1 IMPLANT
KIT BASIN OR (CUSTOM PROCEDURE TRAY) ×1 IMPLANT
KIT TURNOVER KIT B (KITS) ×1 IMPLANT
NDL HYPO 25GX1X1/2 BEV (NEEDLE) ×2 IMPLANT
NEEDLE HYPO 25GX1X1/2 BEV (NEEDLE) ×1 IMPLANT
NS IRRIG 1000ML POUR BTL (IV SOLUTION) ×1 IMPLANT
PAD ARMBOARD 7.5X6 YLW CONV (MISCELLANEOUS) ×1 IMPLANT
SLEEVE IRRIGATION ELITE 7 (MISCELLANEOUS) ×1 IMPLANT
SPIKE FLUID TRANSFER (MISCELLANEOUS) ×1 IMPLANT
SPONGE SURGIFOAM ABS GEL 12-7 (HEMOSTASIS) IMPLANT
SUT CHROMIC 3 0 PS 2 (SUTURE) ×1 IMPLANT
SUT CHROMIC 3 0 SH 27 (SUTURE) IMPLANT
SYR BULB IRRIG 60ML STRL (SYRINGE) ×1 IMPLANT
SYR CONTROL 10ML LL (SYRINGE) ×1 IMPLANT
TRAY ENT MC OR (CUSTOM PROCEDURE TRAY) ×1 IMPLANT
TUBING IRRIGATION (MISCELLANEOUS) ×1 IMPLANT
YANKAUER SUCT BULB TIP NO VENT (SUCTIONS) ×1 IMPLANT

## 2023-09-04 NOTE — H&P (Signed)
H&P documentation  -History and Physical Reviewed  -Patient has been re-examined  -No change in the plan of care  Sue [name]  

## 2023-09-04 NOTE — Anesthesia Procedure Notes (Signed)
Procedure Name: Intubation Date/Time: 09/04/2023 7:40 AM  Performed by: Thomasene Ripple, CRNAPre-anesthesia Checklist: Patient identified, Emergency Drugs available, Suction available and Patient being monitored Patient Re-evaluated:Patient Re-evaluated prior to induction Oxygen Delivery Method: Circle System Utilized Preoxygenation: Pre-oxygenation with 100% oxygen Induction Type: IV induction Ventilation: Nasal airway inserted- appropriate to patient size Laryngoscope Size: Glidescope and 4 Grade View: Grade I Nasal Tubes: Nasal Rae, Nasal prep performed, Left and Magill forceps- large, utilized Number of attempts: 1 Placement Confirmation: ETT inserted through vocal cords under direct vision, positive ETCO2 and breath sounds checked- equal and bilateral Tube secured with: Tape Dental Injury: Teeth and Oropharynx as per pre-operative assessment

## 2023-09-04 NOTE — Op Note (Unsigned)
Sue Sue [name], Sue [name] MEDICAL RECORD NO: [id number] ACCOUNT NO: [id number] DATE OF BIRTH: 1981/03/09 FACILITY: [facility] LOCATION: [facility] PHYSICIAN: Georgia Lopes, DDS  Operative Report   DATE OF PROCEDURE: 09/04/2023  PREOPERATIVE DIAGNOSES: Nonrestorable teeth 2, 4, 5, 7, 10, 12, 15, 29, 30 secondary to dental caries, morbid obesity.  POSTOPERATIVE DIAGNOSES:  Nonrestorable teeth 2, 4, 5, 7, 10, 12, 15, 29, 30 secondary to dental caries, morbid obesity.  PROCEDURE:  Extraction teeth numbers 2, 4, 5, 7, 10, 12, 15, 29 and 30.  SURGEON:  Georgia Lopes, DDS  ANESTHESIA:  General oral intubation, Dr. Malen Gauze attending.  DESCRIPTION OF PROCEDURE:  The patient was taken to the OR, placed on the table in supine position.  General anesthesia was administered and a nasal endotracheal tube was placed and secured.  The eyes were protected and the patient was draped for  surgery.  Timeout was performed.  The posterior pharynx was suctioned and a throat pack was placed.  2% lidocaine in 1:100,000 epinephrine was infiltrated in an inferior alveolar block on the right and left sides and in buccal and palatal infiltration in  the maxilla around the teeth to be removed.  A bite block was placed on the right side of the mouth.  A sweetheart retractor was used to retract the tongue.  A 15 blade was used to make an incision around tooth #20.  The periosteum was reflected with a  periosteal elevator. The tooth was elevated and removed from the mouth with 301 elevator.  The socket was curetted and irrigated.  No suture was necessary.  Attention was turned to the left maxilla. A 15 blade was used to make an incision around teeth  numbers 15, 12 and 10.  The periosteum was reflected.  Tooth #15 was removed with 301 elevator as was tooth #10.  Tooth #12 required removal of circumferential bone with a Stryker handpiece under irrigation. Using a fissure bur, the tooth was then  elevated with 301 elevator and removed  from the mouth and the sockets were curetted and irrigated.  Tooth #12 socket was closed with 3-0 chromic.  The others required no sutures.  Then, the bite block was repositioned to the other side of the mouth.  The  15 blade was used to make an incision around the roots of tooth #30 in the mandible, around teeth numbers 2, 4, 5 and 7 in the maxilla.  The periosteum was reflected from around these teeth.  Tooth #30 was removed with the rongeurs and the socket was  curetted and irrigated.  No suture was required.  Then, in the maxilla tooth #2 was removed using 301 elevator and the rongeur. The socket was curetted, irrigated and closed with 3-0 chromic.  Tooth #7 was removed with 301 elevator and the socket was  curetted and irrigated.  No suture needed.  Then, teeth numbers 4 and 5 were removed using the Stryker handpiece and fissure bur under irrigation to remove circumferential bone. The teeth were then elevated with 301 elevator and removed from the mouth  with the dental forceps and then the sockets were curetted, irrigated and closed with 3-0 chromic.  Then, the oral cavity was irrigated and suctioned.  The throat pack was removed.  The patient was left under care of anesthesia for extubation and  transferred to recovery room with plans for discharge home through day surgery.  ESTIMATED BLOOD LOSS:  Minimal.  COMPLICATIONS:  None.  SPECIMENS:  None.  COUNTS:  Correct.  VAI D: 09/04/2023 8:25:47 am T: 09/04/2023 8:50:00 am  JOB: [id number]/ [id number]

## 2023-09-04 NOTE — Transfer of Care (Signed)
Immediate Anesthesia Transfer of Care Note  Patient: Sue [name]  Procedure(s) Performed: DENTAL RESTORATION/EXTRACTIONS  Patient Location: PACU  Anesthesia Type:General  Level of Consciousness: awake, alert , oriented, and patient cooperative  Airway & Oxygen Therapy: Patient Spontanous Breathing and Patient connected to face mask oxygen  Post-op Assessment: Report given to RN and Post -op Vital signs reviewed and stable  Post vital signs: Reviewed and stable  Last Vitals:  Vitals Value Taken Time  BP 158/98 09/04/23 0845  Temp 36.6 C 09/04/23 0839  Pulse 80 09/04/23 0845  Resp 21 09/04/23 0845  SpO2 97 % 09/04/23 0845    Last Pain:  Vitals:   09/04/23 0656  TempSrc:   PainSc: 7          Complications: No notable events documented.

## 2023-09-04 NOTE — Anesthesia Preprocedure Evaluation (Signed)
Anesthesia Evaluation  Patient identified by MRN, date of birth, ID band Patient awake    Reviewed: Allergy & Precautions, NPO status , Patient's Chart, lab work & pertinent test results, reviewed documented beta blocker date and time   Airway Mallampati: III       Dental  (+) Poor Dentition   Pulmonary asthma , sleep apnea and Continuous Positive Airway Pressure Ventilation    Pulmonary exam normal breath sounds clear to auscultation       Cardiovascular negative cardio ROS Normal cardiovascular exam Rhythm:Regular Rate:Normal     Neuro/Psych  Headaches PSYCHIATRIC DISORDERS Anxiety Depression     Neuromuscular disease    GI/Hepatic Neg liver ROS,GERD  Medicated,,Non restorable teeth   Endo/Other  Hypothyroidism  Morbid obesityHyperlipidemia  Renal/GU negative Renal ROS  negative genitourinary   Musculoskeletal  (+)  Fibromyalgia -  Abdominal  (+) + obese  Peds  Hematology negative hematology ROS (+)   Anesthesia Other Findings   Reproductive/Obstetrics                              Anesthesia Physical Anesthesia Plan  ASA: 3  Anesthesia Plan: General   Post-op Pain Management: Minimal or no pain anticipated   Induction: Intravenous  PONV Risk Score and Plan:   Airway Management Planned: Nasal ETT  Additional Equipment: None  Intra-op Plan:   Post-operative Plan: Extubation in OR  Informed Consent: I have reviewed the patients History and Physical, chart, labs and discussed the procedure including the risks, benefits and alternatives for the proposed anesthesia with the patient or authorized representative who has indicated his/her understanding and acceptance.     Dental advisory given  Plan Discussed with: Anesthesiologist and CRNA  Anesthesia Plan Comments:          Anesthesia Quick Evaluation

## 2023-09-04 NOTE — Op Note (Signed)
09/04/2023  8:21 AM  PATIENT:  Sue [name]  42 y.o. female  PRE-OPERATIVE DIAGNOSIS:  Nonrestorable teeth # 2, 4, 5, 7, 10, 12, 15, 29, 30 secondary to dental caries.;   POST-OPERATIVE DIAGNOSIS:  SAME  PROCEDURE:  Procedure(s): EXTRACTION teeth # 2, 4, 5, 7, 10, 12, 15, 29, 30  SURGEON:  Surgeon(s): Ocie Doyne, DMD  ANESTHESIA:   local and general  EBL:  minimal  DRAINS: none   SPECIMEN:  No Specimen  COUNTS:  YES  PLAN OF CARE: Discharge to home after PACU  PATIENT DISPOSITION:  PACU - hemodynamically stable.   PROCEDURE DETAILS: Dictation # [id number]  Georgia Lopes, DMD 09/04/2023 8:21 AM

## 2023-09-04 NOTE — Anesthesia Postprocedure Evaluation (Signed)
Anesthesia Post Note  Patient: Sue [name]  Procedure(s) Performed: DENTAL RESTORATION/EXTRACTIONS     Patient location during evaluation: PACU Anesthesia Type: General Level of consciousness: awake and alert and oriented Pain management: pain level controlled Vital Signs Assessment: post-procedure vital signs reviewed and stable Respiratory status: spontaneous breathing, nonlabored ventilation and respiratory function stable Cardiovascular status: blood pressure returned to baseline and stable Postop Assessment: no apparent nausea or vomiting Anesthetic complications: no   No notable events documented.  Last Vitals:  Vitals:   09/04/23 0839 09/04/23 0845  BP: (!) 151/104 (!) 158/98  Pulse: 79 80  Resp: (!) 22 (!) 21  Temp: 36.6 C   SpO2: 97% 97%    Last Pain:  Vitals:   09/04/23 0656  TempSrc:   PainSc: 7                  Tracie Lindbloom A.

## 2023-09-05 ENCOUNTER — Encounter (HOSPITAL_COMMUNITY): Payer: Self-pay | Admitting: Oral Surgery

## 2023-09-07 NOTE — Plan of Care (Signed)
[facility] Tonsillectomy/Adenoidectomy, Postoperative PEDS care plan entered in error.

## 2023-09-30 ENCOUNTER — Ambulatory Visit (INDEPENDENT_AMBULATORY_CARE_PROVIDER_SITE_OTHER): Payer: MEDICAID | Admitting: Internal Medicine

## 2023-09-30 ENCOUNTER — Encounter: Payer: Self-pay | Admitting: *Deleted

## 2023-09-30 ENCOUNTER — Encounter: Payer: Self-pay | Admitting: Internal Medicine

## 2023-09-30 VITALS — BP 143/93 | HR 81 | Temp 97.8°F | Ht 64.0 in | Wt 330.0 lb

## 2023-09-30 DIAGNOSIS — R1310 Dysphagia, unspecified: Secondary | ICD-10-CM

## 2023-09-30 DIAGNOSIS — G8929 Other chronic pain: Secondary | ICD-10-CM

## 2023-09-30 DIAGNOSIS — Z791 Long term (current) use of non-steroidal anti-inflammatories (NSAID): Secondary | ICD-10-CM

## 2023-09-30 DIAGNOSIS — R1013 Epigastric pain: Secondary | ICD-10-CM

## 2023-09-30 DIAGNOSIS — K219 Gastro-esophageal reflux disease without esophagitis: Secondary | ICD-10-CM | POA: Diagnosis not present

## 2023-09-30 DIAGNOSIS — R1319 Other dysphagia: Secondary | ICD-10-CM

## 2023-09-30 MED ORDER — PANTOPRAZOLE SODIUM 40 MG PO TBEC: 40.0000 mg | DELAYED_RELEASE_TABLET | Freq: Two times a day (BID) | ORAL | 11 refills | Status: DC

## 2023-09-30 NOTE — Progress Notes (Signed)
Primary Care Physician:  Kara Pacer, NP Primary Gastroenterologist:  Dr. Marletta Lor  Chief Complaint  Patient presents with   New Patient (Initial Visit)    Patient here today as a new patient. She was referred here due to issues with Left upper abdominal pain that radiates to her back. Patient reports she went to the Ed at [facility] on 09/19/2023, where they did blood work and Ct scan. They told her they suspect it was a peptic ulcer and was prescribed Carafate. She is taking pantoprazole 20 mg every am and famotidine 40 mg at bedtime. Symptoms started after being on amoxicillin due to tooth extraction.    HPI:   Sue [name] is a 42 y.o. female who presents the clinic today as a new patient by referral from her PCP Colleen Can for evaluation.  Presented to [facility] ER 09/19/23 with significant epigastric pain.  Patient states she was out of her pantoprazole for approximately 1 month prior to this.  CT abdomen pelvis without contrast unremarkable.  Blood work unremarkable.  Discharged on pantoprazole and famotidine.  States she has had similar flares in the past.  Last flare 2021 when she was diagnosed with pancreatitis.  CT abdomen pelvis with IV contrast did not show any evidence of pancreatitis though she had a mildly elevated lipase of 121.  Since ER visit, symptoms slightly improved though continues to have intermittent episodes of epigastric pain.  Also notes dysphagia feeling like food will sit in her substernal region.  No regurgitation of food.  Having breakthrough acid reflux and heartburn.  Feels like her stomach "is burning."  Currently taking pantoprazole 20 mg daily, famotidine 40 mg daily.  Chronically takes naproxen twice daily has been for many years.  No previous upper endoscopy.  Denies any hematochezia.  Does note occasionally will have dark stools.  Past Medical History:  Diagnosis Date   Anxiety    Asthma    Depression     Fibromyalgia    GERD (gastroesophageal reflux disease)    Headache    Hypothyroidism    Sleep apnea     Past Surgical History:  Procedure Laterality Date   ABDOMINAL HYSTERECTOMY     2018   BREAST REDUCTION SURGERY  2009   TOOTH EXTRACTION N/A 09/04/2023   Procedure: DENTAL RESTORATION/EXTRACTIONS;  Surgeon: Ocie Doyne, DMD;  Location: [facility] OR;  Service: Oral Surgery;  Laterality: N/A;    Current Outpatient Medications  Medication Sig Dispense Refill   atorvastatin (LIPITOR) 20 MG tablet Take 20 mg by mouth daily.     BLACK COHOSH HOT FLASH RELIEF PO Take 1 capsule by mouth daily as needed (hot flashes).     cetirizine (ZYRTEC) 10 MG tablet Take 10 mg by mouth daily as needed for allergies.     famotidine (PEPCID) 20 MG tablet Take 40 mg by mouth at bedtime.     Fluticasone Propionate, Inhal, (FLUTICASONE PROPIONATE DISKUS) 100 MCG/ACT AEPB Inhale 1 puff into the lungs daily as needed (Asthma symptoms).     gabapentin (NEURONTIN) 600 MG tablet Take 600 mg by mouth 2 (two) times daily.     levothyroxine (SYNTHROID) 75 MCG tablet Take 75 mcg by mouth daily before breakfast.     naproxen sodium (ALEVE) 220 MG tablet Take 220 mg by mouth 2 (two) times daily with a meal.     pantoprazole (PROTONIX) 20 MG tablet Take 20 mg by mouth daily.     propranolol (INDERAL) 20 MG tablet  Take 20 mg by mouth 2 (two) times daily.     rizatriptan (MAXALT) 10 MG tablet Take 10 mg by mouth as needed for migraine. May repeat in 2 hours if needed     tiZANidine (ZANAFLEX) 4 MG tablet Take 4 mg by mouth at bedtime.     topiramate (TOPAMAX) 50 MG tablet Take 50 mg by mouth 2 (two) times daily.     OXcarbazepine (TRILEPTAL) 150 MG tablet Take 150 mg by mouth 2 (two) times daily.     No current facility-administered medications for this visit.    Allergies as of 09/30/2023   (No Known Allergies)    History reviewed. No pertinent family history.  Social History   Socioeconomic History   Marital  status: Single    Spouse name: Not on file   Number of children: Not on file   Years of education: Not on file   Highest education level: Not on file  Occupational History   Not on file  Tobacco Use   Smoking status: Former    Types: Cigarettes   Smokeless tobacco: Never  Vaping Use   Vaping status: Never Used  Substance and Sexual Activity   Alcohol use: Not Currently   Drug use: Not Currently   Sexual activity: Not on file  Other Topics Concern   Not on file  Social History Narrative   Not on file   Social Determinants of Health   Financial Resource Strain: Low Risk  (07/31/2022)   Received from [facility], [facility]   Overall Financial Resource Strain (CARDIA)    Difficulty of Paying Living Expenses: Not hard at all  Food Insecurity: No Food Insecurity (07/31/2022)   Received from [facility], [facility]   Hunger Vital Sign    Worried About Running Out of Food in the Last Year: Never true    Ran Out of Food in the Last Year: Never true  Transportation Needs: No Transportation Needs (07/31/2022)   Received from [name], [facility]   PRAPARE - Transportation    Lack of Transportation (Medical): No    Lack of Transportation (Non-Medical): No  Physical Activity: Not on file  Stress: No Stress Concern Present (07/31/2022)   Received from [facility], [facility]   [facility] - Occupational Stress Questionnaire    Feeling of Stress : Not at all  Social Connections: Not on file  Intimate Partner Violence: Not At Risk (04/07/2023)   Received from [facility], [facility]   Humiliation, Afraid, Rape, and Kick questionnaire    Fear of Current or Ex-Partner: No    Emotionally Abused: No    Physically Abused: No    Sexually Abused: No    Subjective: Review of Systems  Constitutional:  Negative for chills and fever.  HENT:  Negative for congestion and hearing loss.   Eyes:  Negative for blurred  vision and double vision.  Respiratory:  Negative for cough and shortness of breath.   Cardiovascular:  Negative for chest pain and palpitations.  Gastrointestinal:  Positive for abdominal pain and heartburn. Negative for blood in stool, constipation, diarrhea, melena and vomiting.  Genitourinary:  Negative for dysuria and urgency.  Musculoskeletal:  Negative for joint pain and myalgias.  Skin:  Negative for itching and rash.  Neurological:  Negative for dizziness and headaches.  Psychiatric/Behavioral:  Negative for depression. The patient is not nervous/anxious.  Objective: BP (!) 143/93 (BP Location: Left Arm, Patient Position: Sitting, Cuff Size: Normal)   Pulse 81   Temp 97.8 F (36.6 C) (Temporal)   Ht 5\' 4"  (1.626 m)   Wt (!) 330 lb (149.7 kg)   BMI 56.64 kg/m  Physical Exam Constitutional:      Appearance: Normal appearance. She is obese.  HENT:     Head: Normocephalic and atraumatic.  Eyes:     Extraocular Movements: Extraocular movements intact.     Conjunctiva/sclera: Conjunctivae normal.  Cardiovascular:     Rate and Rhythm: Normal rate and regular rhythm.  Pulmonary:     Effort: Pulmonary effort is normal.     Breath sounds: Normal breath sounds.  Abdominal:     General: Bowel sounds are normal.     Palpations: Abdomen is soft.  Musculoskeletal:        General: No swelling. Normal range of motion.     Cervical back: Normal range of motion and neck supple.  Skin:    General: Skin is warm and dry.     Coloration: Skin is not jaundiced.  Neurological:     General: No focal deficit present.     Mental Status: She is alert and oriented to person, place, and time.  Psychiatric:        Mood and Affect: Mood normal.        Behavior: Behavior normal.      Assessment: *Chronic GERD-uncontrolled *Epigastric pain *Esophageal dysphagia *Chronic NSAID use  Plan: Will schedule for EGD with possible dilation to evaluate for peptic ulcer disease,  esophagitis, gastritis, H. Pylori, duodenitis, or other. Will also evaluate for esophageal stricture, Schatzki's ring, esophageal web or other.   The risks including infection, bleed, or perforation as well as benefits, limitations, alternatives and imponderables have been reviewed with the patient. Potential for esophageal dilation, biopsy, etc. have also been reviewed.  Questions have been answered. All parties agreeable.  I will increase her pantoprazole to 40 mg twice daily for the next 10 to 12 weeks.  Stop famotidine.  Home practices printed off for patient to reduce reflux symptoms.  Thank you Colleen Can for the kind referral.   09/30/2023 11:27 AM   Disclaimer: This note was dictated with voice recognition software. Similar sounding words can inadvertently be transcribed and may not be corrected upon review.

## 2023-09-30 NOTE — Patient Instructions (Addendum)
We will schedule you for upper endoscopy to further evaluate your chronic acid reflux, abdominal pain, feeling like food gets stuck in your chest.  I may elect to stretch your esophagus depending on findings.  I am going to increase your pantoprazole to 40 mg twice daily.  You can stop your famotidine.  It was very nice meeting both you today.  Dr. Marletta Lor  Lifestyle and home remedies TO MANAGE REFLUX/HEARTBURN    You may eliminate or reduce the frequency of heartburn by making the following lifestyle changes:   Control your weight. Being overweight is a major risk factor for heartburn and GERD. Excess pounds put pressure on your abdomen, pushing up your stomach and causing acid to back up into your esophagus.    Eat smaller meals. 4 TO 6 MEALS A DAY. This reduces pressure on the lower esophageal sphincter, helping to prevent the valve from opening and acid from washing back into your esophagus.     Loosen your belt. Clothes that fit tightly around your waist put pressure on your abdomen and the lower esophageal sphincter.     Eliminate heartburn triggers. Everyone has specific triggers. Common triggers such as fatty or fried foods, spicy food, tomato sauce, carbonated beverages, alcohol, chocolate, mint, garlic, onion, caffeine and nicotine may make heartburn worse.    Avoid stooping or bending. Tying your shoes is OK. Bending over for longer periods to weed your garden isn't, especially soon after eating.    Don't lie down after a meal. Wait at least three to four hours after eating before going to bed, and don't lie down right after eating.

## 2023-10-01 ENCOUNTER — Encounter: Payer: Self-pay | Admitting: Internal Medicine

## 2023-10-01 ENCOUNTER — Encounter: Payer: Self-pay | Admitting: *Deleted

## 2023-10-29 ENCOUNTER — Other Ambulatory Visit (HOSPITAL_COMMUNITY): Payer: Self-pay | Admitting: Adult Health

## 2023-10-29 DIAGNOSIS — Z1231 Encounter for screening mammogram for malignant neoplasm of breast: Secondary | ICD-10-CM

## 2023-11-04 ENCOUNTER — Ambulatory Visit (HOSPITAL_COMMUNITY)
Admission: RE | Admit: 2023-11-04 | Discharge: 2023-11-04 | Disposition: A | Discharge status: Home / Self Care | Payer: MEDICAID | Source: Ambulatory Visit | Attending: Adult Health | Admitting: Adult Health

## 2023-11-04 ENCOUNTER — Encounter (HOSPITAL_COMMUNITY)
Admission: RE | Admit: 2023-11-04 | Discharge: 2023-11-04 | Disposition: A | Discharge status: Home / Self Care | Payer: MEDICAID | Source: Ambulatory Visit | Attending: Internal Medicine

## 2023-11-04 VITALS — Ht 64.0 in | Wt 328.5 lb

## 2023-11-04 DIAGNOSIS — Z6841 Body Mass Index (BMI) 40.0 and over, adult: Secondary | ICD-10-CM | POA: Diagnosis present

## 2023-11-04 DIAGNOSIS — E66813 Obesity, class 3: Secondary | ICD-10-CM | POA: Diagnosis present

## 2023-11-04 DIAGNOSIS — Z1231 Encounter for screening mammogram for malignant neoplasm of breast: Secondary | ICD-10-CM | POA: Insufficient documentation

## 2023-11-04 NOTE — Patient Instructions (Signed)
20    Your procedure is scheduled on: 11/06/2023  Report to [facility] Main Entrance at  11;00   AM.  Call this number if you have problems the morning of surgery: [phone]   Remember:   Follow instructions on letter from office regarding when to stop eating and drinking        No Smoking the day of procedure      Take these medicines the morning of surgery with A SIP OF WATER: inderal, Gabapentin, pantoprazole, and levothyroxine   Do not wear jewelry, make-up or nail polish.  Do not wear lotions, powders, or perfumes. You may wear deodorant.                Do not bring valuables to the hospital.  Contacts, dentures or bridgework may not be worn into surgery.  Leave suitcase in the car. After surgery it may be brought to your room.  For patients admitted to the hospital, checkout time is 11:00 AM the day of discharge.   Patients discharged the day of surgery will not be allowed to drive home. Upper Endoscopy, Adult Upper endoscopy is a procedure to look inside the upper GI (gastrointestinal) tract. The upper GI tract is made up of: The part of the body that moves food from your mouth to your stomach (esophagus). The stomach. The first part of your small intestine (duodenum). This procedure is also called esophagogastroduodenoscopy (EGD) or gastroscopy. In this procedure, your health care provider passes a thin, flexible tube (endoscope) through your mouth and down your esophagus into your stomach. A small camera is attached to the end of the tube. Images from the camera appear on a monitor in the exam room. During this procedure, your health care provider may also remove a small piece of tissue to be sent to a lab and examined under a microscope (biopsy). Your health care provider may do an upper endoscopy to diagnose cancers of the upper GI tract. You may also have this procedure to find the cause of other conditions, such as: Stomach pain. Heartburn. Pain or problems when  swallowing. Nausea and vomiting. Stomach bleeding. Stomach ulcers. Tell a health care provider about: Any allergies you have. All medicines you are taking, including vitamins, herbs, eye drops, creams, and over-the-counter medicines. Any problems you or family members have had with anesthetic medicines. Any blood disorders you have. Any surgeries you have had. Any medical conditions you have. Whether you are pregnant or may be pregnant. What are the risks? Generally, this is a safe procedure. However, problems may occur, including: Infection. Bleeding. Allergic reactions to medicines. A tear or hole (perforation) in the esophagus, stomach, or duodenum. What happens before the procedure? Staying hydrated Follow instructions from your health care provider about hydration, which may include: Up to 4 hours before the procedure - you may continue to drink clear liquids, such as water, clear fruit juice, black coffee, and plain tea.   Medicines Ask your health care provider about: Changing or stopping your regular medicines. This is especially important if you are taking diabetes medicines or blood thinners. Taking medicines such as aspirin and ibuprofen. These medicines can thin your blood. Do not take these medicines unless your health care provider tells you to take them. Taking over-the-counter medicines, vitamins, herbs, and supplements. General instructions Plan to have someone take you home from the hospital or clinic. If you will be going home right after the procedure, plan to have someone with you for 24 hours.  Ask your health care provider what steps will be taken to help prevent infection. What happens during the procedure?  An IV will be inserted into one of your veins. You may be given one or more of the following: A medicine to help you relax (sedative). A medicine to numb the throat (local anesthetic). You will lie on your left side on an exam table. Your health care  provider will pass the endoscope through your mouth and down your esophagus. Your health care provider will use the scope to check the inside of your esophagus, stomach, and duodenum. Biopsies may be taken. The endoscope will be removed. The procedure may vary among health care providers and hospitals. What happens after the procedure? Your blood pressure, heart rate, breathing rate, and blood oxygen level will be monitored until you leave the hospital or clinic. Do not drive for 24 hours if you were given a sedative during your procedure. When your throat is no longer numb, you may be given some fluids to drink. It is up to you to get the results of your procedure. Ask your health care provider, or the department that is doing the procedure, when your results will be ready. Summary Upper endoscopy is a procedure to look inside the upper GI tract. During the procedure, an IV will be inserted into one of your veins. You may be given a medicine to help you relax. A medicine will be used to numb your throat. The endoscope will be passed through your mouth and down your esophagus. This information is not intended to replace advice given to you by your health care provider. Make sure you discuss any questions you have with your health care provider. Document Revised: 04/28/2018 Document Reviewed: 04/05/2018 Elsevier Patient Education  2020 [employer]                                                                                                                                      EndoscopyCare After  Please read the instructions outlined below and refer to this sheet in the next few weeks. These discharge instructions provide you with general information on caring for yourself after you leave the hospital. Your doctor may also give you specific instructions. While your treatment has been planned according to the most current medical practices available, unavoidable complications occasionally  occur. If you have any problems or questions after discharge, please call your doctor. HOME CARE INSTRUCTIONS Activity You may resume your regular activity but move at a slower pace for the next 24 hours.  Take frequent rest periods for the next 24 hours.  Walking will help expel (get rid of) the air and reduce the bloated feeling in your abdomen.  No driving for 24 hours (because of the anesthesia (medicine) used during the test).  You may shower.  Do not sign any important legal documents or operate any machinery for 24 hours (because of the  anesthesia used during the test).  Nutrition Drink plenty of fluids.  You may resume your normal diet.  Begin with a light meal and progress to your normal diet.  Avoid alcoholic beverages for 24 hours or as instructed by your caregiver.  Medications You may resume your normal medications unless your caregiver tells you otherwise. What you can expect today You may experience abdominal discomfort such as a feeling of fullness or "gas" pains.  You may experience a sore throat for 2 to 3 days. This is normal. Gargling with salt water may help this.  Follow-up Your doctor will discuss the results of your test with you. SEEK IMMEDIATE MEDICAL CARE IF: You have excessive nausea (feeling sick to your stomach) and/or vomiting.  You have severe abdominal pain and distention (swelling).  You have trouble swallowing.  You have a temperature over 100 F (37.8 C).  You have rectal bleeding or vomiting of blood.  Document Released: 06/17/2004 Document Revised: 10/23/2011 Document Reviewed: 12/29/2007

## 2023-11-06 ENCOUNTER — Encounter (HOSPITAL_COMMUNITY): Payer: Self-pay

## 2023-11-06 ENCOUNTER — Ambulatory Visit (HOSPITAL_COMMUNITY)
Admission: RE | Admit: 2023-11-06 | Discharge: 2023-11-06 | Disposition: A | Discharge status: Home / Self Care | Payer: MEDICAID | Attending: Internal Medicine | Admitting: Internal Medicine

## 2023-11-06 ENCOUNTER — Ambulatory Visit (HOSPITAL_COMMUNITY): Payer: MEDICAID | Admitting: Anesthesiology

## 2023-11-06 ENCOUNTER — Encounter (HOSPITAL_COMMUNITY)
Admission: RE | Disposition: A | Discharge status: Home / Self Care | Payer: Self-pay | Source: Home / Self Care | Attending: Internal Medicine

## 2023-11-06 ENCOUNTER — Other Ambulatory Visit: Payer: Self-pay

## 2023-11-06 DIAGNOSIS — K2970 Gastritis, unspecified, without bleeding: Secondary | ICD-10-CM | POA: Diagnosis not present

## 2023-11-06 DIAGNOSIS — Z79890 Hormone replacement therapy (postmenopausal): Secondary | ICD-10-CM | POA: Diagnosis not present

## 2023-11-06 DIAGNOSIS — R1310 Dysphagia, unspecified: Secondary | ICD-10-CM | POA: Diagnosis present

## 2023-11-06 DIAGNOSIS — K2950 Unspecified chronic gastritis without bleeding: Secondary | ICD-10-CM | POA: Diagnosis not present

## 2023-11-06 DIAGNOSIS — G4730 Sleep apnea, unspecified: Secondary | ICD-10-CM | POA: Diagnosis not present

## 2023-11-06 DIAGNOSIS — K219 Gastro-esophageal reflux disease without esophagitis: Secondary | ICD-10-CM | POA: Diagnosis not present

## 2023-11-06 DIAGNOSIS — J45909 Unspecified asthma, uncomplicated: Secondary | ICD-10-CM | POA: Insufficient documentation

## 2023-11-06 DIAGNOSIS — E039 Hypothyroidism, unspecified: Secondary | ICD-10-CM | POA: Insufficient documentation

## 2023-11-06 DIAGNOSIS — Z87891 Personal history of nicotine dependence: Secondary | ICD-10-CM | POA: Insufficient documentation

## 2023-11-06 HISTORY — PX: BALLOON DILATION: SHX5330

## 2023-11-06 HISTORY — PX: BIOPSY: SHX5522

## 2023-11-06 HISTORY — PX: ESOPHAGOGASTRODUODENOSCOPY (EGD) WITH PROPOFOL: SHX5813

## 2023-11-06 SURGERY — ESOPHAGOGASTRODUODENOSCOPY (EGD) WITH PROPOFOL
Anesthesia: General

## 2023-11-06 MED ORDER — LIDOCAINE HCL (CARDIAC) PF 100 MG/5ML IV SOSY
PREFILLED_SYRINGE | INTRAVENOUS | Status: DC | PRN
  Administered 2023-11-06: 100 mg via INTRAVENOUS

## 2023-11-06 MED ORDER — LIDOCAINE HCL (PF) 2 % IJ SOLN
INTRAMUSCULAR | Status: AC
  Filled 2023-11-06: qty 5

## 2023-11-06 MED ORDER — PROPOFOL 10 MG/ML IV BOLUS
INTRAVENOUS | Status: DC | PRN
  Administered 2023-11-06: 50 mg via INTRAVENOUS

## 2023-11-06 MED ORDER — LACTATED RINGERS IV SOLN: INTRAVENOUS | Status: DC

## 2023-11-06 MED ORDER — PROPOFOL 500 MG/50ML IV EMUL
INTRAVENOUS | Status: DC | PRN
  Administered 2023-11-06: 150 ug/kg/min via INTRAVENOUS

## 2023-11-06 MED ORDER — LACTATED RINGERS IV SOLN: INTRAVENOUS | Status: DC | PRN

## 2023-11-06 NOTE — Discharge Instructions (Addendum)
EGD Discharge instructions Please read the instructions outlined below and refer to this sheet in the next few weeks. These discharge instructions provide you with general information on caring for yourself after you leave the hospital. Your doctor may also give you specific instructions. While your treatment has been planned according to the most current medical practices available, unavoidable complications occasionally occur. If you have any problems or questions after discharge, please call your doctor. ACTIVITY You may resume your regular activity but move at a slower pace for the next 24 hours.  Take frequent rest periods for the next 24 hours.  Walking will help expel (get rid of) the air and reduce the bloated feeling in your abdomen.  No driving for 24 hours (because of the anesthesia (medicine) used during the test).  You may shower.  Do not sign any important legal documents or operate any machinery for 24 hours (because of the anesthesia used during the test).  NUTRITION Drink plenty of fluids.  You may resume your normal diet.  Begin with a light meal and progress to your normal diet.  Avoid alcoholic beverages for 24 hours or as instructed by your caregiver.  MEDICATIONS You may resume your normal medications unless your caregiver tells you otherwise.  WHAT YOU CAN EXPECT TODAY You may experience abdominal discomfort such as a feeling of fullness or "gas" pains.  FOLLOW-UP Your doctor will discuss the results of your test with you.  SEEK IMMEDIATE MEDICAL ATTENTION IF ANY OF THE FOLLOWING OCCUR: Excessive nausea (feeling sick to your stomach) and/or vomiting.  Severe abdominal pain and distention (swelling).  Trouble swallowing.  Temperature over 101 F (37.8 C).  Rectal bleeding or vomiting of blood.   Your EGD revealed mild amount inflammation in your stomach.  I took biopsies of this to rule out infection with a bacteria called H. pylori.  Await pathology results, my  office will contact you.  I did stretch your esophagus today, Hopefully this helps with feeling of food getting stuck. Small bowel appeared normal.   Continue on Pantoprazole twice daily.   Follow up in GI office in 2-3 months.  Office will notify you   I hope you have a great rest of your week!  Hennie Duos. Marletta Lor, D.O. Gastroenterology and Hepatology [facility]

## 2023-11-06 NOTE — Transfer of Care (Addendum)
Immediate Anesthesia Transfer of Care Note  Patient: Sue [name]  Procedure(s) Performed: ESOPHAGOGASTRODUODENOSCOPY (EGD) WITH PROPOFOL BALLOON DILATION BIOPSY  Patient Location: Short Stay  Anesthesia Type:General  Level of Consciousness: drowsy and patient cooperative  Airway & Oxygen Therapy: Patient Spontanous Breathing  Post-op Assessment: Report given to RN and Post -op Vital signs reviewed and stable  Post vital signs: Reviewed and stable  Last Vitals:  Vitals Value Taken Time  BP 102/53 11/06/23 1223  Temp 36.4 11/06/23   1223  Pulse 68 11/06/23 1223  Resp 17 11/06/23 1223  SpO2 95 % 11/06/23 1223    Last Pain:  Vitals:   11/06/23 1223  TempSrc:   PainSc: 0-No pain         Complications: No notable events documented.

## 2023-11-06 NOTE — Anesthesia Preprocedure Evaluation (Signed)
Anesthesia Evaluation  Patient identified by MRN, date of birth, ID band Patient awake    Reviewed: Allergy & Precautions, H&P , NPO status , Patient's Chart, lab work & pertinent test results, reviewed documented beta blocker date and time   Airway Mallampati: II  TM Distance: >3 FB Neck ROM: full    Dental no notable dental hx.    Pulmonary neg pulmonary ROS, asthma , sleep apnea , Patient abstained from smoking., former smoker   Pulmonary exam normal breath sounds clear to auscultation       Cardiovascular Exercise Tolerance: Good hypertension, negative cardio ROS  Rhythm:regular Rate:Normal     Neuro/Psych  Headaches PSYCHIATRIC DISORDERS Anxiety Depression     Neuromuscular disease negative neurological ROS  negative psych ROS   GI/Hepatic negative GI ROS, Neg liver ROS,GERD  ,,  Endo/Other  negative endocrine ROSHypothyroidism    Renal/GU negative Renal ROS  negative genitourinary   Musculoskeletal   Abdominal   Peds  Hematology negative hematology ROS (+)   Anesthesia Other Findings   Reproductive/Obstetrics negative OB ROS                             Anesthesia Physical Anesthesia Plan  ASA: 2  Anesthesia Plan: General   Post-op Pain Management:    Induction:   PONV Risk Score and Plan: Propofol infusion  Airway Management Planned:   Additional Equipment:   Intra-op Plan:   Post-operative Plan:   Informed Consent: I have reviewed the patients History and Physical, chart, labs and discussed the procedure including the risks, benefits and alternatives for the proposed anesthesia with the patient or authorized representative who has indicated his/her understanding and acceptance.     Dental Advisory Given  Plan Discussed with: CRNA  Anesthesia Plan Comments:        Anesthesia Quick Evaluation

## 2023-11-06 NOTE — Op Note (Signed)
[facility] Patient Name: Sue [name] Procedure Date: 11/06/2023 11:57 AM MRN: [id number] Date of Birth: 07-24-1981 Attending MD: Hennie Duos. Marletta Lor , [place], [id number] CSN: [id number] Age: 42 Admit Type: Outpatient Procedure:                Upper GI endoscopy Indications:              Dysphagia, Heartburn Providers:                Hennie Duos. Marletta Lor, DO, Buel Ream. Museum/gallery exhibitions officer, Charity fundraiser,                            Judeth Cornfield. Jessee Avers, Technician Referring MD:              Medicines:                See the Anesthesia note for documentation of the                            administered medications Complications:            No immediate complications. Estimated Blood Loss:     Estimated blood loss was minimal. Procedure:                Pre-Anesthesia Assessment:                           - The anesthesia plan was to use monitored                            anesthesia care (MAC).                           After obtaining informed consent, the endoscope was                            passed under direct vision. Throughout the                            procedure, the patient's blood pressure, pulse, and                            oxygen saturations were monitored continuously. The                            GIF-H190 ([id number]) scope was introduced through the                            mouth, and advanced to the second part of duodenum.                            The upper GI endoscopy was accomplished without                            difficulty. The patient tolerated the procedure                            well.  Scope In: 12:13:08 PM Scope Out: 12:17:29 PM Total Procedure Duration: 0 hours 4 minutes 21 seconds  Findings:      The examined esophagus was normal.      No endoscopic abnormality was evident in the esophagus to explain the       patient's complaint of dysphagia. Preparations were made for empiric       dilation. A TTS dilator was passed through the scope. Dilation with an        18-19-20 mm balloon dilator was performed to 20 mm. Dilation was       performed with a mild resistance at 20 mm. Estimated blood loss was none.      Patchy mild inflammation characterized by erosions and erythema was       found in the gastric body and in the gastric antrum. Biopsies were taken       with a cold forceps for Helicobacter pylori testing.      The duodenal bulb, first portion of the duodenum and second portion of       the duodenum were normal. Impression:               - Normal esophagus.                           - Gastritis. Biopsied.                           - Normal duodenal bulb, first portion of the                            duodenum and second portion of the duodenum. Moderate Sedation:      Per Anesthesia Care Recommendation:           - Patient has a contact number available for                            emergencies. The signs and symptoms of potential                            delayed complications were discussed with the                            patient. Return to normal activities tomorrow.                            Written discharge instructions were provided to the                            patient.                           - Resume previous diet.                           - Continue present medications.                           - Await pathology results.                           -  Repeat upper endoscopy PRN for retreatment.                           - Return to GI clinic in 3 months.                           - Use a proton pump inhibitor PO BID.                           - Work on [employer] loss Procedure Code(s):        --- Professional ---                           [phone], Esophagogastroduodenoscopy, flexible,                            transoral; with biopsy, single or multiple Diagnosis Code(s):        --- Professional ---                           K29.70, Gastritis, unspecified, without bleeding                           R13.10,  Dysphagia, unspecified                           R12, Heartburn CPT copyright 2022 American Medical Association. All rights reserved. The codes documented in this report are preliminary and upon coder review may  be revised to meet current compliance requirements. Hennie Duos. Marletta Lor, DO Hennie Duos. Marletta Lor, DO 11/06/2023 12:25:08 PM This report has been signed electronically. Number of Addenda: 0

## 2023-11-06 NOTE — H&P (Signed)
Primary Care Physician:  Kara Pacer, NP Primary Gastroenterologist:  Dr. Marletta Lor  Pre-Procedure History & Physical: HPI:  Sue [name] is a 42 y.o. female is here for an EGD with possible dilation due to history of dysphagia, GERD  Past Medical History:  Diagnosis Date   Anxiety    Asthma    Depression    Fibromyalgia    GERD (gastroesophageal reflux disease)    Headache    Hypothyroidism    Sleep apnea     Past Surgical History:  Procedure Laterality Date   ABDOMINAL HYSTERECTOMY     2018   BREAST REDUCTION SURGERY  2009   TOOTH EXTRACTION N/A 09/04/2023   Procedure: DENTAL RESTORATION/EXTRACTIONS;  Surgeon: Ocie Doyne, DMD;  Location: [facility] OR;  Service: Oral Surgery;  Laterality: N/A;    Prior to Admission medications   Medication Sig Start Date End Date Taking? Authorizing Provider  atorvastatin (LIPITOR) 20 MG tablet Take 20 mg by mouth daily.   Yes [provider]  cetirizine (ZYRTEC) 10 MG tablet Take 10 mg by mouth daily as needed for allergies.   Yes [provider]  gabapentin (NEURONTIN) 600 MG tablet Take 600 mg by mouth 2 (two) times daily.   Yes [provider]  levothyroxine (SYNTHROID) 75 MCG tablet Take 75 mcg by mouth daily before breakfast.   Yes [provider]  naproxen sodium (ALEVE) 220 MG tablet Take 220 mg by mouth 2 (two) times daily with a meal.   Yes [provider]  pantoprazole (PROTONIX) 40 MG tablet Take 1 tablet (40 mg total) by mouth 2 (two) times daily. 09/30/23 09/29/24 Yes Jonise Weightman, Hennie Duos, DO  propranolol (INDERAL) 20 MG tablet Take 20 mg by mouth 2 (two) times daily.   Yes [provider]  rizatriptan (MAXALT) 10 MG tablet Take 10 mg by mouth as needed for migraine. May repeat in 2 hours if needed   Yes [provider]  tiZANidine (ZANAFLEX) 4 MG tablet Take 4 mg by mouth at bedtime.   Yes [provider]  topiramate (TOPAMAX) 50 MG tablet Take 50 mg by  mouth 2 (two) times daily.   Yes [provider]  BLACK COHOSH HOT FLASH RELIEF PO Take 1 capsule by mouth daily as needed (hot flashes).    [provider]  Fluticasone Propionate, Inhal, (FLUTICASONE PROPIONATE DISKUS) 100 MCG/ACT AEPB Inhale 1 puff into the lungs daily as needed (Asthma symptoms). 04/08/23   [provider]  OXcarbazepine (TRILEPTAL) 150 MG tablet Take 150 mg by mouth 2 (two) times daily.    [provider]    Allergies as of 09/30/2023   (No Known Allergies)    History reviewed. No pertinent family history.  Social History   Socioeconomic History   Marital status: Single    Spouse name: Not on file   Number of children: Not on file   Years of education: Not on file   Highest education level: Not on file  Occupational History   Not on file  Tobacco Use   Smoking status: Former    Types: Cigarettes   Smokeless tobacco: Never  Vaping Use   Vaping status: Never Used  Substance and Sexual Activity   Alcohol use: Not Currently   Drug use: Not Currently   Sexual activity: Not on file  Other Topics Concern   Not on file  Social History Narrative   Not on file   Social Drivers of Health   Financial Resource Strain:  Low Risk  (07/31/2022)   Received from [facility], [facility]   Overall Financial Resource Strain (CARDIA)    Difficulty of Paying Living Expenses: Not hard at all  Food Insecurity: No Food Insecurity (07/31/2022)   Received from [facility], [facility]   Hunger Vital Sign    Worried About Running Out of Food in the Last Year: Never true    Ran Out of Food in the Last Year: Never true  Transportation Needs: No Transportation Needs (07/31/2022)   Received from [facility], [facility]   [facility] - Transportation    Lack of Transportation (Medical): No    Lack of Transportation (Non-Medical): No  Physical Activity: Not on file  Stress: No Stress Concern Present (07/31/2022)   Received  from [facility], [facility]   [facility] - Occupational Stress Questionnaire    Feeling of Stress : Not at all  Social Connections: Not on file  Intimate Partner Violence: Not At Risk (04/07/2023)   Received from [facility], [facility]   Humiliation, Afraid, Rape, and Kick questionnaire    Fear of Current or Ex-Partner: No    Emotionally Abused: No    Physically Abused: No    Sexually Abused: No    Review of Systems: General: Negative for fever, chills, fatigue, weakness. Eyes: Negative for vision changes.  ENT: Negative for hoarseness, difficulty swallowing , nasal congestion. CV: Negative for chest pain, angina, palpitations, dyspnea on exertion, peripheral edema.  Respiratory: Negative for dyspnea at rest, dyspnea on exertion, cough, sputum, wheezing.  GI: See history of present illness. GU:  Negative for dysuria, hematuria, urinary incontinence, urinary frequency, nocturnal urination.  MS: Negative for joint pain, low back pain.  Derm: Negative for rash or itching.  Neuro: Negative for weakness, abnormal sensation, seizure, frequent headaches, memory loss, confusion.  Psych: Negative for anxiety, depression Endo: Negative for unusual weight change.  Heme: Negative for bruising or bleeding. Allergy: Negative for rash or hives.  Physical Exam: Vital signs in last 24 hours: Temp:  [98 F (36.7 C)] 98 F (36.7 C) (12/20 1128) Pulse Rate:  [62] 62 (12/20 1128) Resp:  [18] 18 (12/20 1128) BP: (146)/(80) 146/80 (12/20 1128) SpO2:  [98 %] 98 % (12/20 1128) Weight:  [146 kg] 146 kg (12/20 1128)   General:   Alert,  Well-developed, well-nourished, pleasant and cooperative in NAD Head:  Normocephalic and atraumatic. Eyes:  Sclera clear, no icterus.   Conjunctiva pink. Ears:  Normal auditory acuity. Nose:  No deformity, discharge,  or lesions. Msk:  Symmetrical without gross deformities. Normal posture. Extremities:  Without  clubbing or edema. Neurologic:  Alert and  oriented x4;  grossly normal neurologically. Skin:  Intact without significant lesions or rashes. Psych:  Alert and cooperative. Normal mood and affect.   Impression/Plan: Sue [name] is here for an EGD with possible dilation due to history of dysphagia, GERD  Risks, benefits, limitations, imponderables and alternatives regarding procedure have been reviewed with the patient. Questions have been answered. All parties agreeable.

## 2023-11-10 LAB — SURGICAL PATHOLOGY

## 2023-11-10 NOTE — Anesthesia Postprocedure Evaluation (Signed)
Anesthesia Post Note  Patient: Sue [name]  Procedure(s) Performed: ESOPHAGOGASTRODUODENOSCOPY (EGD) WITH PROPOFOL BALLOON DILATION BIOPSY  Patient location during evaluation: Phase II Anesthesia Type: General Level of consciousness: awake Pain management: pain level controlled Vital Signs Assessment: post-procedure vital signs reviewed and stable Respiratory status: spontaneous breathing and respiratory function stable Cardiovascular status: blood pressure returned to baseline and stable Postop Assessment: no headache and no apparent nausea or vomiting Anesthetic complications: no Comments: Late entry   No notable events documented.   Last Vitals:  Vitals:   11/06/23 1128 11/06/23 1223  BP: (!) 146/80 (!) 102/53  Pulse: 62 68  Resp: 18 17  Temp: 36.7 C 36.4 C  SpO2: 98% 95%    Last Pain:  Vitals:   11/09/23 1239  TempSrc:   PainSc: 0-No pain                 Windell Norfolk

## 2023-11-26 ENCOUNTER — Encounter (HOSPITAL_COMMUNITY): Payer: Self-pay | Admitting: Internal Medicine

## 2024-01-19 ENCOUNTER — Encounter: Payer: Self-pay | Admitting: Internal Medicine

## 2024-03-01 NOTE — Progress Notes (Unsigned)
 GI Office Note    Referring Provider: Katherine Basset* Primary Care Physician:  Kara Pacer, NP Primary Gastroenterologist: Hennie Duos. Marletta Lor, DO  Date:  03/02/2024  ID:  Sue [name], DOB 04/19/1981, MRN [id number]   Chief Complaint   Chief Complaint  Patient presents with   Follow-up    History of Present Illness  Sue [name] is a 43 y.o. female with a history of GERD, anxiety, depression, asthma, fibromyalgia, hypothyroidism, and sleep apnea presenting today for follow-up of reflux and epigastric pain post EGD.  ED visit at [facility] in November 2024 with significant epigastric pain.  She had been out of pantoprazole for a month.  CT A/P without contrast was unremarkable.  Labs normal.  She was discharged on famotidine and pantoprazole  Initial office visit 09/30/23 with Dr. Marletta Lor.  During visit she reported similar flares to her ED visit in the past with last flare in 2021.  States she was also diagnosed with pancreatitis at that time.  She did have mildly elevated lipase at that time but no CT evidence of pancreatitis.  Symptoms improved since ED visit however continues to have intermittent epigastric pain and notes some dysphagia as well.  No regurgitation of food and having breakthrough acid reflux when heartburn.  Current dose of pantoprazole was 20 mg with famotidine 40 mg daily.  Also notes chronic naproxen use twice daily for many years.  PPI was increased to 40 mg twice daily for 10-12 weeks and to stop famotidine.  Scheduled for EGD with possible dilation.  EGD 11/06/2023: - Normal esophagus.  S/p dilation - Gastritis. Biopsied.  - Normal duodenal bulb and second portion of duodenum - Advised PPI twice daily and work on weight loss - Biopsies with focal mild chronic inactive gastritis, negative H. pylori.  Today:  GERD is improved - not having any breakthrough symptoms.   Doing well since dilation.  Appetite is normal. Has been more conscious of  food choices. Not over doing anything as she does not want to get hurt but has been more mobile. Eating more salads. Does not eat lots of fried foods since moving here.   Wt Readings from Last 3 Encounters:  03/02/24 (!) 331 lb 9.6 oz (150.4 kg)  11/06/23 (!) 321 lb 14 oz (146 kg)  11/04/23 (!) 328 lb 7.8 oz (149 kg)    Current Outpatient Medications  Medication Sig Dispense Refill   atorvastatin (LIPITOR) 20 MG tablet Take 20 mg by mouth daily.     BLACK COHOSH HOT FLASH RELIEF PO Take 1 capsule by mouth daily as needed (hot flashes).     cetirizine (ZYRTEC) 10 MG tablet Take 10 mg by mouth daily as needed for allergies.     Fluticasone Propionate, Inhal, (FLUTICASONE PROPIONATE DISKUS) 100 MCG/ACT AEPB Inhale 1 puff into the lungs daily as needed (Asthma symptoms).     gabapentin (NEURONTIN) 600 MG tablet Take 600 mg by mouth 2 (two) times daily.     levothyroxine (SYNTHROID) 75 MCG tablet Take 75 mcg by mouth daily before breakfast.     NAPROXEN SODIUM PO Take 500 mg by mouth 2 (two) times daily with a meal.     pantoprazole (PROTONIX) 40 MG tablet Take 1 tablet (40 mg total) by mouth 2 (two) times daily. 60 tablet 11   propranolol (INDERAL) 20 MG tablet Take 20 mg by mouth 2 (two) times daily.     rizatriptan (MAXALT) 10 MG tablet Take 10 mg by mouth as needed  for migraine. May repeat in 2 hours if needed     tiZANidine (ZANAFLEX) 4 MG tablet Take 4 mg by mouth at bedtime.     topiramate (TOPAMAX) 50 MG tablet Take 50 mg by mouth 3 (three) times daily.     No current facility-administered medications for this visit.    Past Medical History:  Diagnosis Date   Anxiety    Asthma    Depression    Fibromyalgia    GERD (gastroesophageal reflux disease)    Headache    Hypothyroidism    Sleep apnea     Past Surgical History:  Procedure Laterality Date   ABDOMINAL HYSTERECTOMY     2018   BALLOON DILATION N/A 11/06/2023   Procedure: BALLOON DILATION;  Surgeon: Vinetta Greening,  DO;  [facility];  Service: Endoscopy;  Laterality: N/A;   BIOPSY  11/06/2023   Procedure: BIOPSY;  Surgeon: Vinetta Greening, DO;  [facility];  Service: Endoscopy;;   BREAST REDUCTION SURGERY  2009   ESOPHAGOGASTRODUODENOSCOPY (EGD) WITH PROPOFOL N/A 11/06/2023   Procedure: ESOPHAGOGASTRODUODENOSCOPY (EGD) WITH PROPOFOL;  Surgeon: Vinetta Greening, DO;  Location: [facility];  Service: Endoscopy;  Laterality: N/A;  1:00pm, asa 3   TOOTH EXTRACTION N/A 09/04/2023   Procedure: DENTAL RESTORATION/EXTRACTIONS;  Surgeon: Ascencion Lava, DMD;  Location: [facility] OR;  Service: Oral Surgery;  Laterality: N/A;    History reviewed. No pertinent family history.  Allergies as of 03/02/2024 - Review Complete 03/02/2024  Allergen Reaction Noted   Dust mite extract  03/02/2024   Shrimp (diagnostic)  03/02/2024    Social History   Socioeconomic History   Marital status: Single    Spouse name: Not on file   Number of children: Not on file   Years of education: Not on file   Highest education level: Not on file  Occupational History   Not on file  Tobacco Use   Smoking status: Former    Types: Cigarettes   Smokeless tobacco: Never  Vaping Use   Vaping status: Never Used  Substance and Sexual Activity   Alcohol use: Not Currently   Drug use: Not Currently   Sexual activity: Not on file  Other Topics Concern   Not on file  Social History Narrative   Not on file   Social Drivers of Health   Financial Resource Strain: Low Risk  (07/31/2022)   Received from [facility], [facility]   Overall Financial Resource Strain (CARDIA)    Difficulty of Paying Living Expenses: Not hard at all  Food Insecurity: No Food Insecurity (07/31/2022)   Received from [facility], [facility]   Hunger Vital Sign    Worried About Running Out of Food in the Last Year: Never true    Ran Out of Food in the Last Year: Never true  Transportation Needs: No Transportation Needs  (07/31/2022)   Received from [facility], [facility]   [facility] - Transportation    Lack of Transportation (Medical): No    Lack of Transportation (Non-Medical): No  Physical Activity: Not on file  Stress: No Stress Concern Present (07/31/2022)   Received from [facility], [facility]   [facility] of Occupational Health - Occupational Stress Questionnaire    Feeling of Stress : Not at all  Social Connections: Not on file   Review of Systems   Gen: Denies fever, chills, anorexia. Denies fatigue, weakness, weight loss.  CV:  Denies chest pain, palpitations, syncope, peripheral edema, and claudication. Resp: Denies dyspnea at rest, cough, wheezing, coughing up blood, and pleurisy. GI: See HPI Derm: Denies rash, itching, dry skin Psych: Denies depression, anxiety, memory loss, confusion. No homicidal or suicidal ideation.  Heme: Denies bruising, bleeding, and enlarged lymph nodes.  Physical Exam   BP 130/82 (BP Location: Right Arm, Patient Position: Sitting, Cuff Size: Large)   Pulse 64   Temp 97.9 F (36.6 C) (Oral)   Ht 5\' 4"  (1.626 m)   Wt (!) 331 lb 9.6 oz (150.4 kg)   SpO2 99%   BMI 56.92 kg/m   General:   Alert and oriented. No distress noted. Pleasant and cooperative.  Head:  Normocephalic and atraumatic. Eyes:  Conjuctiva clear without scleral icterus. Mouth:  Oral mucosa pink and moist. Good dentition. No lesions. Rectal: deferred Msk:  Symmetrical without gross deformities. Normal posture. Extremities:  Without edema. Neurologic:  Alert and  oriented x4 Psych:  Alert and cooperative. Normal mood and affect.  Assessment  Camyla Camposano is a 43 y.o. female with a history of GERD, anxiety, depression, asthma, fibromyalgia, hypothyroidism, and sleep apnea presenting today for follow-up of reflux and epigastric pain post EGD.  GERD, epigastric pain: - Gastritis noted on recent EGD with biopsies negative for H. Pylori - Epigastric pain and reflux  likely worsened by previous NSAID use and liekly stress - Reports history of ulcer in 2013 - Currently with good control of symptoms with pantoprazole BID, will continue for now - May consider reducing to once daily at follow up.  - Congratulated on weight loss.  - Pathology and procedure reviewed with patient in office today.   Dysphagia: - EGD with normal esophagus s/p dilation -Symptoms improved since dilation.   PLAN   Pantoprazole 40 mg BID Tums or pepcid as needed GERD diet Continue with goal of weight loss.  Follow up in 6 months  Julian Obey, MSN, FNP-BC, AGACNP-BC [facility]

## 2024-03-02 ENCOUNTER — Ambulatory Visit (INDEPENDENT_AMBULATORY_CARE_PROVIDER_SITE_OTHER): Payer: MEDICAID | Admitting: Gastroenterology

## 2024-03-02 ENCOUNTER — Encounter: Payer: Self-pay | Admitting: Gastroenterology

## 2024-03-02 VITALS — BP 130/82 | HR 64 | Temp 97.9°F | Ht 64.0 in | Wt 331.6 lb

## 2024-03-02 DIAGNOSIS — K219 Gastro-esophageal reflux disease without esophagitis: Secondary | ICD-10-CM | POA: Diagnosis not present

## 2024-03-02 DIAGNOSIS — R1319 Other dysphagia: Secondary | ICD-10-CM

## 2024-03-02 DIAGNOSIS — R1013 Epigastric pain: Secondary | ICD-10-CM | POA: Diagnosis not present

## 2024-03-02 DIAGNOSIS — R1310 Dysphagia, unspecified: Secondary | ICD-10-CM

## 2024-03-02 DIAGNOSIS — G8929 Other chronic pain: Secondary | ICD-10-CM

## 2024-03-02 NOTE — Patient Instructions (Addendum)
 Continue pantoprazole 40 mg twice daily.  You may use Tums or Pepcid as needed for breakthrough.  Follow a GERD diet:  Avoid fried, fatty, greasy, spicy, citrus foods. Avoid caffeine and carbonated beverages. Avoid chocolate. Try eating 4-6 small meals a day rather than 3 large meals. Do not eat within 3 hours of laying down. Prop head of bed up on wood or bricks to create a 6 inch incline.  Continue to limit NSAIDs (Aleve, Advil, ibuprofen, meloxicam, BC or Goody powders)  Please let us  know if you begin developing worsening symptoms, nausea, or vomiting that does not improve within a few days, or recurrent issues swallowing.  It was a pleasure to see you today. I want to create trusting relationships with patients. If you receive a survey regarding your visit,  I greatly appreciate you taking time to fill this out on paper or through your MyChart. I value your feedback.  Julian Obey, MSN, FNP-BC, AGACNP-BC [facility]

## 2024-04-05 ENCOUNTER — Ambulatory Visit: Payer: MEDICAID | Admitting: "Endocrinology

## 2024-08-02 ENCOUNTER — Encounter: Payer: Self-pay | Admitting: Gastroenterology

## 2024-10-05 ENCOUNTER — Other Ambulatory Visit: Payer: Self-pay | Admitting: Internal Medicine

## 2024-11-18 ENCOUNTER — Other Ambulatory Visit (HOSPITAL_COMMUNITY): Payer: Self-pay | Admitting: Adult Health

## 2024-11-18 DIAGNOSIS — Z1231 Encounter for screening mammogram for malignant neoplasm of breast: Secondary | ICD-10-CM

## 2024-11-21 ENCOUNTER — Ambulatory Visit (HOSPITAL_COMMUNITY)
Admission: RE | Admit: 2024-11-21 | Discharge: 2024-11-21 | Disposition: A | Discharge status: Home / Self Care | Payer: MEDICAID | Source: Ambulatory Visit | Attending: Adult Health | Admitting: Adult Health

## 2024-11-21 DIAGNOSIS — Z1231 Encounter for screening mammogram for malignant neoplasm of breast: Secondary | ICD-10-CM | POA: Insufficient documentation

## 2025-01-16 ENCOUNTER — Ambulatory Visit: Payer: MEDICAID | Admitting: Diagnostic Neuroimaging
